# Patient Record
Sex: Male | Born: 1955 | Race: White | Hispanic: No | Marital: Single | Smoking: Former smoker
Health system: Southern US, Community
[De-identification: ages and names within clinical notes are randomized; demographics above are authoritative.]

## PROBLEM LIST (undated history)

## (undated) DIAGNOSIS — I10 Essential (primary) hypertension: Secondary | ICD-10-CM

## (undated) DIAGNOSIS — K4090 Unilateral inguinal hernia, without obstruction or gangrene, not specified as recurrent: Secondary | ICD-10-CM

## (undated) DIAGNOSIS — Z8619 Personal history of other infectious and parasitic diseases: Secondary | ICD-10-CM

## (undated) HISTORY — DX: Personal history of other infectious and parasitic diseases: Z86.19

## (undated) HISTORY — DX: Unilateral inguinal hernia, without obstruction or gangrene, not specified as recurrent: K40.90

---

## 2005-08-23 ENCOUNTER — Emergency Department: Payer: Self-pay | Admitting: Unknown Physician Specialty

## 2008-01-06 IMAGING — CR RIGHT TIBIA AND FIBULA - 2 VIEW
1 series · 3 of 3 positions shown · non-contrast
Comparison: none

REASON FOR EXAM: Injury
COMMENTS:

RESULT:     AP and lateral views of the RIGHT lower leg show no fracture,
dislocation or other acute bony abnormality. There is noted a nail which
extends partially into the soft tissues by about 3.5 cm. No associated bony
abnormality is seen.

[Series 1: view not recorded · 0.17mm/px · 3 of 3 slices shown]
[im 1/3]
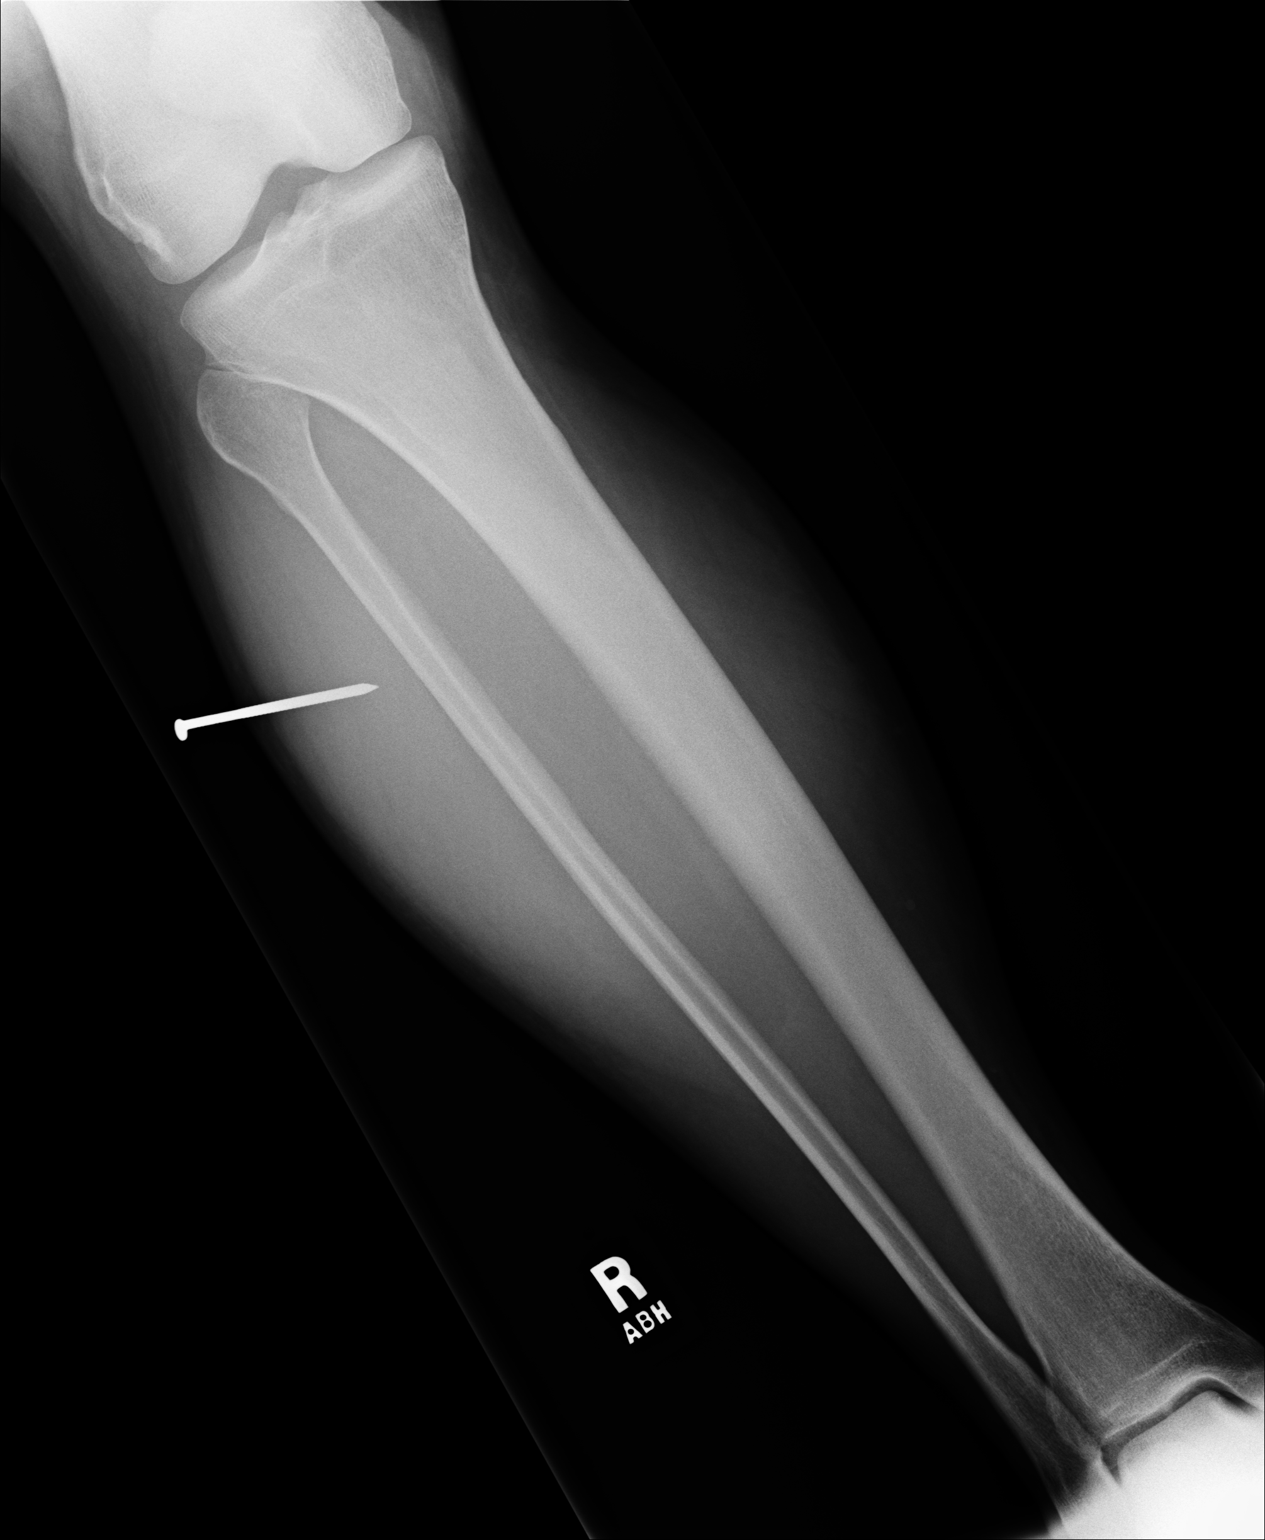
[im 2/3]
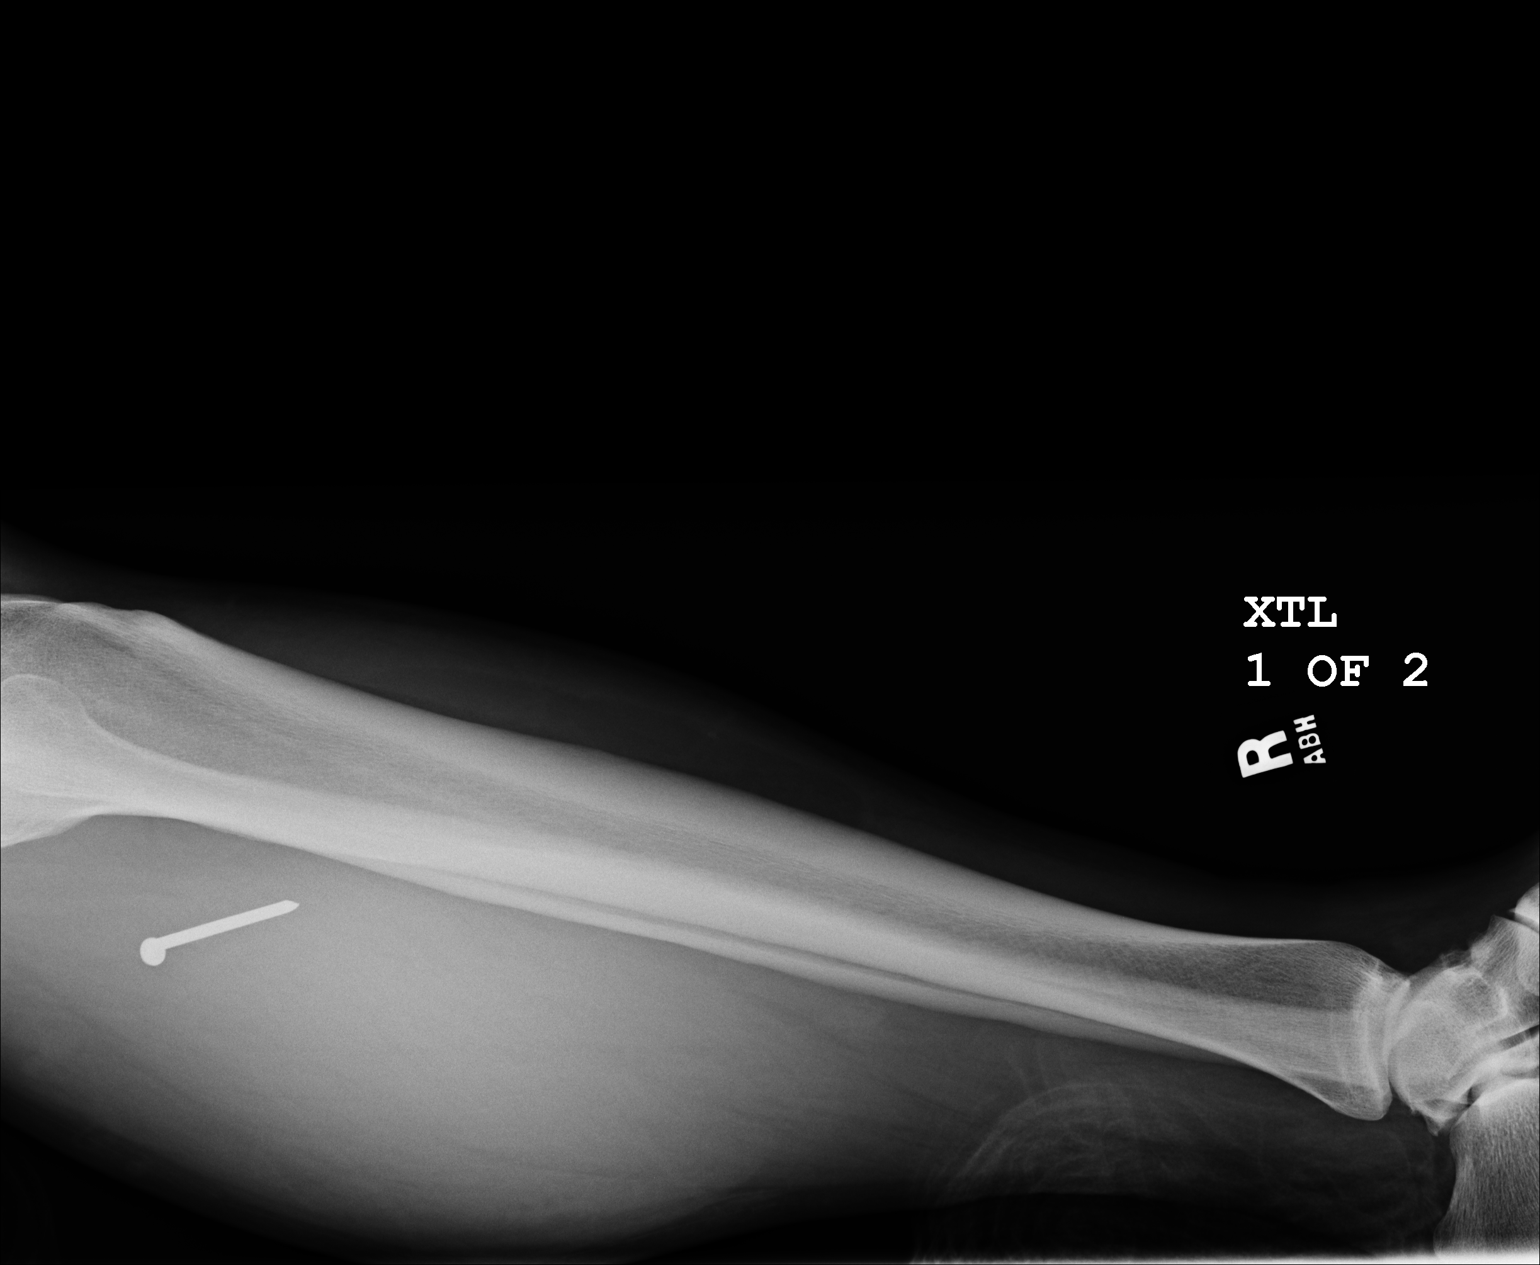
[im 3/3]
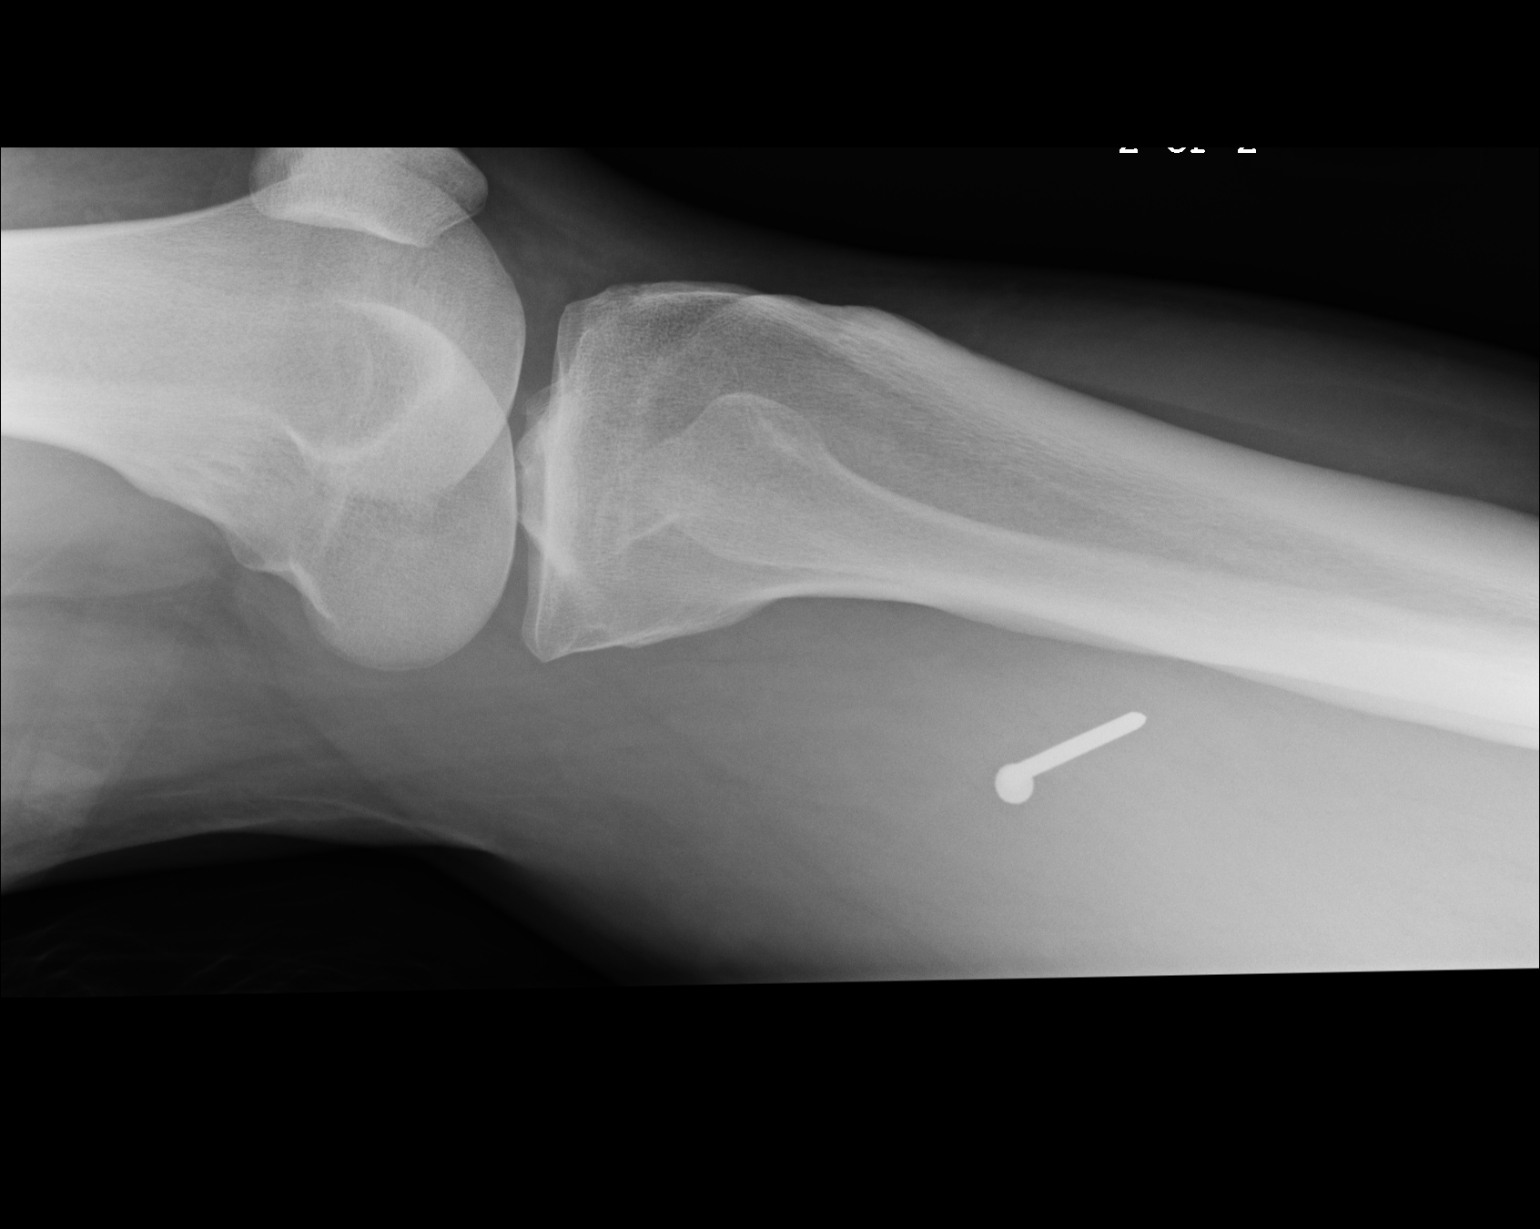

[3 of 3 positions shown; findings below may reference images not displayed]

IMPRESSION: 1.     No fracture or other acute bony abnormality is seen.
2.     There is a soft tissue foreign body consistent with a nail as noted
above.

## 2008-09-21 ENCOUNTER — Ambulatory Visit: Payer: Self-pay | Admitting: Family Medicine

## 2008-09-21 DIAGNOSIS — M67919 Unspecified disorder of synovium and tendon, unspecified shoulder: Secondary | ICD-10-CM | POA: Insufficient documentation

## 2008-09-21 DIAGNOSIS — M719 Bursopathy, unspecified: Secondary | ICD-10-CM

## 2008-09-21 DIAGNOSIS — M25519 Pain in unspecified shoulder: Secondary | ICD-10-CM

## 2008-10-18 ENCOUNTER — Ambulatory Visit: Payer: Self-pay | Admitting: Family Medicine

## 2008-12-10 ENCOUNTER — Ambulatory Visit: Payer: Self-pay | Admitting: Family Medicine

## 2008-12-13 LAB — CONVERTED CEMR LAB
BUN: 13 mg/dL (ref 6–23)
CO2: 29 meq/L (ref 19–32)
Chloride: 104 meq/L (ref 96–112)
Cholesterol: 184 mg/dL (ref 0–200)
Creatinine, Ser: 1.1 mg/dL (ref 0.4–1.5)
Glucose, Bld: 84 mg/dL (ref 70–99)
Potassium: 3.9 meq/L (ref 3.5–5.1)
Triglycerides: 135 mg/dL (ref 0.0–149.0)

## 2008-12-28 ENCOUNTER — Encounter (INDEPENDENT_AMBULATORY_CARE_PROVIDER_SITE_OTHER): Payer: Self-pay | Admitting: *Deleted

## 2008-12-28 ENCOUNTER — Ambulatory Visit: Payer: Self-pay | Admitting: Family Medicine

## 2008-12-28 DIAGNOSIS — R5383 Other fatigue: Secondary | ICD-10-CM

## 2008-12-28 DIAGNOSIS — R5381 Other malaise: Secondary | ICD-10-CM | POA: Insufficient documentation

## 2008-12-28 LAB — CONVERTED CEMR LAB

## 2008-12-30 LAB — CONVERTED CEMR LAB
ALT: 21 units/L (ref 0–53)
AST: 19 units/L (ref 0–37)
Albumin: 3.7 g/dL (ref 3.5–5.2)
Alkaline Phosphatase: 115 units/L (ref 39–117)
Basophils Absolute: 0 10*3/uL (ref 0.0–0.1)
Bilirubin, Direct: 0.1 mg/dL (ref 0.0–0.3)
Eosinophils Absolute: 0.4 10*3/uL (ref 0.0–0.7)
Hemoglobin: 16.5 g/dL (ref 13.0–17.0)
Lymphocytes Relative: 13.5 % (ref 12.0–46.0)
MCHC: 34.1 g/dL (ref 30.0–36.0)
Neutro Abs: 6.8 10*3/uL (ref 1.4–7.7)
Platelets: 228 10*3/uL (ref 150.0–400.0)
RDW: 11.8 % (ref 11.5–14.6)
Total Protein: 6.9 g/dL (ref 6.0–8.3)

## 2009-01-22 HISTORY — PX: COLONOSCOPY: SHX174

## 2009-02-04 ENCOUNTER — Encounter (INDEPENDENT_AMBULATORY_CARE_PROVIDER_SITE_OTHER): Payer: Self-pay | Admitting: *Deleted

## 2009-02-07 ENCOUNTER — Ambulatory Visit: Payer: Self-pay | Admitting: Gastroenterology

## 2009-02-21 ENCOUNTER — Ambulatory Visit: Payer: Self-pay | Admitting: Gastroenterology

## 2009-02-25 ENCOUNTER — Encounter: Payer: Self-pay | Admitting: Gastroenterology

## 2010-01-09 ENCOUNTER — Encounter: Payer: Self-pay | Admitting: Gastroenterology

## 2010-02-16 ENCOUNTER — Encounter: Payer: Self-pay | Admitting: Gastroenterology

## 2010-02-21 NOTE — Miscellaneous (Signed)
Summary: [facility] Previsit/prep  Clinical Lists Changes  Medications: Added new medication of MOVIPREP 100 GM  SOLR (PEG-KCL-NACL-NASULF-NA ASC-C) As per prep instructions. - Signed Rx of MOVIPREP 100 GM  SOLR (PEG-KCL-NACL-NASULF-NA ASC-C) As per prep instructions.;  #1 x 0;  Signed;  Entered by: Wyona Almas RN;  Authorized by: Rachael Fee MD;  Method used: [id number] [address], [place], [place]  [address], Ph: [phone], Fax: [phone] Observations: Added new observation of NKA: T (02/07/2009 8:23)    Prescriptions: MOVIPREP 100 GM  SOLR (PEG-KCL-NACL-NASULF-NA ASC-C) As per prep instructions.  #1 x 0   Entered by:   Wyona Almas RN   Authorized by:   Rachael Fee MD   Signed by:   Wyona Almas RN on 02/07/2009   Method used:   [facility]ronically to        [id number] ([facility]       [address]       [place], [place]  [address]       Ph: [phone]       Fax: [phone]   RxID:   [id number]

## 2010-02-21 NOTE — Procedures (Signed)
Summary: Colonoscopy  Patient: [name] Note: All result statuses are Final unless otherwise noted.  Tests: (1) Colonoscopy (COL)   COL Colonoscopy           DONE     [facility]     [address]     [place], [place]  [address]           COLONOSCOPY PROCEDURE REPORT           PATIENT:  Curtis Stewart, Curtis Stewart  MR#:  [id number]     BIRTHDATE:  10-18-1955, 53 yrs. old  GENDER:  male           ENDOSCOPIST:  Rachael Fee, MD     Referred by:  Elpidio Galea. Copland, M.D.           PROCEDURE DATE:  02/21/2009     PROCEDURE:  Colonoscopy with snare polypectomy     ASA CLASS:  Class II     INDICATIONS:  Routine Risk Screening           MEDICATIONS:   Fentanyl 100 mcg IV, Versed 10 mg IV           DESCRIPTION OF PROCEDURE:   After the risks benefits and     alternatives of the procedure were thoroughly explained, informed     consent was obtained.  Digital rectal exam was performed and     revealed no rectal masses.   The LB CF-[id number] endoscope     was introduced through the anus and advanced to the cecum, which     was identified by both the appendix and ileocecal valve, without     limitations.  The quality of the prep was good, using MoviPrep.     The instrument was then slowly withdrawn as the colon was fully     examined.     <<PROCEDUREIMAGES>>           FINDINGS:  There were nine small polyps, all sessile. All were     removed and sent to pathology (jar 1). These ranged in size from     2mm to 5mm, were located in cecum, ascending, transverse,     descending colon segments and were all removed with cold snare     (see image3, image4, image5, and image6).  Moderate diverticulosis     was found. These were located throughout colon, most predominantly     in right colon (see image3).  External hemorrhoids were found.     These were medium sized, non-thrombosed.  This was otherwise a     normal examination of the colon (see image1, image2, and image7).  Retroflexed views in the rectum revealed no abnormalities.  The     scope was then withdrawn from the patient and the procedure     completed.           COMPLICATIONS:  None           ENDOSCOPIC IMPRESSION:     1) Nine small polyps, all removed and sent to pathology     2) Moderate diverticulosis (mainly in right colon)     3) External hemorrhoids     4) Otherwise normal examination           RECOMMENDATIONS:     1) If the polyp(s) removed today are proven to be adenomatous     (pre-cancerous) polyps, you will need a colonoscopy in 1-3 years.     Otherwise you should continue to follow  colorectal cancer     screening guidelines for "routine risk" patients with a     colonoscopy in 10 years.     2) You will receive a letter within 1-2 weeks with the results     of your biopsy as well as final recommendations. Please call my     office if you have not received a letter after 3 weeks.           ______________________________     Rachael Fee, MD           n.     eSIGNED:   Rachael Fee at 02/21/2009 09:35 AM           Georjean Mode, [id number]  Note: An exclamation mark (!) indicates a result that was not dispersed into the flowsheet. Document Creation Date: 02/21/2009 9:35 AM _______________________________________________________________________  (1) Order result status: Final Collection or observation date-time: 02/21/2009 09:28 Requested date-time:  Receipt date-time:  Reported date-time:  Referring Physician:   Ordering Physician: Rob Bunting [phone]) Specimen Source:  Source: Launa Grill Order Number: [id number] Lab site:   Appended Document: Colonoscopy recall     Procedures Next Due Date:    Colonoscopy: 01/2010

## 2010-02-21 NOTE — Letter (Signed)
Summary: [facility] Instructions  [facility]  [address] [place], [place] [address]   Phone: [phone]  Fax: [phone]       Curtis Stewart    01-Feb-1955    MRN: [id number]        Procedure Day /Date:  02/21/09  Monday     Arrival Time:_8:00am      Procedure Time:_9:00am     Location of Procedure:                    _x _  [facility] (4th Floor)                        PREPARATION FOR COLONOSCOPY WITH MOVIPREP   Starting 5 days prior to your procedure _1/26/11 _ do not eat nuts, seeds, popcorn, corn, beans, peas,  salads, or any raw vegetables.  Do not take any fiber supplements (e.g. Metamucil, Citrucel, and Benefiber).  THE DAY BEFORE YOUR PROCEDURE         DATE:  02/20/09  DAY:  Sunday  1.  Drink clear liquids the entire day-NO SOLID FOOD  2.  Do not drink anything colored red or purple.  Avoid juices with pulp.  No orange juice.  3.  Drink at least 64 oz. (8 glasses) of fluid/clear liquids during the day to prevent dehydration and help the prep work efficiently.  CLEAR LIQUIDS INCLUDE: Water Jello Ice Popsicles Tea (sugar ok, no milk/cream) Powdered fruit flavored drinks Coffee (sugar ok, no milk/cream) Gatorade Juice: apple, white grape, white cranberry  Lemonade Clear bullion, consomm, broth Carbonated beverages (any kind) Strained chicken noodle soup Hard Candy                             4.  In the morning, mix first dose of MoviPrep solution:    Empty 1 Pouch A and 1 Pouch B into the disposable container    Add lukewarm drinking water to the top line of the container. Mix to dissolve    Refrigerate (mixed solution should be used within 24 hrs)  5.  Begin drinking the prep at 5:00 p.m. The MoviPrep container is divided by 4 marks.   Every 15 minutes drink the solution down to the next mark (approximately 8 oz) until the full liter is complete.   6.  Follow completed prep with 16 oz of clear liquid of your choice  (Nothing red or purple).  Continue to drink clear liquids until bedtime.  7.  Before going to bed, mix second dose of MoviPrep solution:    Empty 1 Pouch A and 1 Pouch B into the disposable container    Add lukewarm drinking water to the top line of the container. Mix to dissolve    Refrigerate  THE DAY OF YOUR PROCEDURE      DATE:  02/21/09 DAY:  Monday  Beginning at _ 4:00 a.m. (5 hours before procedure):         1. Every 15 minutes, drink the solution down to the next mark (approx 8 oz) until the full liter is complete.  2. Follow completed prep with 16 oz. of clear liquid of your choice.    3. You may drink clear liquids until _7:00am  (2 HOURS BEFORE PROCEDURE).   MEDICATION INSTRUCTIONS  Unless otherwise instructed, you should take regular prescription medications with a small sip of water   as early  as possible the morning of your procedure.        OTHER INSTRUCTIONS  You will need a responsible adult at least 54 years of age to accompany you and drive you home.   This person must remain in the waiting room during your procedure.  Wear loose fitting clothing that is easily removed.  Leave jewelry and other valuables at home.  However, you may wish to bring a book to read or  an iPod/MP3 player to listen to music as you wait for your procedure to start.  Remove all body piercing jewelry and leave at home.  Total time from sign-in until discharge is approximately 2-3 hours.  You should go home directly after your procedure and rest.  You can resume normal activities the  day after your procedure.  The day of your procedure you should not:   Drive   Make legal decisions   Operate machinery   Drink alcohol   Return to work  You will receive specific instructions about eating, activities and medications before you leave.    The above instructions have been reviewed and explained to me by   Wyona Almas RN  February 07, 2009 8:58 AM     I fully  understand and can verbalize these instructions _____________________________ Date _________

## 2010-02-21 NOTE — Letter (Signed)
Summary: Results Letter  [facility]  [address] [place], [place] [address]   Phone: [phone]  Fax: [phone]        February 25, 2009 MRN: [id number]    JACEK COLSON [address] - AT [facility] [place], [place]  [address]    Dear Mr. Frye,   The polyp(s) removed during your recent procedure were proven to be adenomatous.  These are pre-cancerous polyps that may have grown into cancers if they had not been removed.  Based on current nationally recognized surveillance guidelines, I recommend that you have a repeat colonoscopy in 1 year.   We will therefore put your information in our reminder system and will contact you in 1 year to schedule a repeat procedure.  Please call if you have any questions or concerns.       Sincerely,  Rachael Fee MD  This letter has been electronically signed by your physician.  Appended Document: Results Letter Letter mailed 2.8.11

## 2010-02-23 NOTE — Letter (Signed)
Summary: Colonoscopy Letter  [facility]  [address] [place], [place] [address]   Phone: [phone]  Fax: [phone]      February 16, 2010 MRN: [id number]   Curtis Stewart [address] - AT [facility] [place], [place]  [address]   Dear Mr. Brazel,   According to your medical record, it is time for you to schedule a Colonoscopy. The American Cancer Society recommends this procedure as a method to detect early colon cancer. Patients with a family history of colon cancer, or a personal history of colon polyps or inflammatory bowel disease are at increased risk.  This letter has been generated based on the recommendations made at the time of your procedure. If you feel that in your particular situation this may no longer apply, please contact our office.  Please call our office at [phone] to schedule this appointment or to update your records at your earliest convenience.  Thank you for cooperating with [place] to provide you with the very best care possible.   Sincerely,  Rachael Fee, M.D.  [facility] [phone]

## 2010-03-01 NOTE — Procedures (Signed)
Summary: Recall Assessment/[facility]  Recall Assessment/[facility]   Imported By: Sherian Rein 02/20/2010 11:52:11  _____________________________________________________________________  External Attachment:    Type:   Image     Comment:   External Document

## 2011-10-05 ENCOUNTER — Encounter: Payer: Self-pay | Admitting: Gastroenterology

## 2012-03-05 ENCOUNTER — Ambulatory Visit: Payer: Self-pay | Admitting: Family Medicine

## 2012-03-10 ENCOUNTER — Encounter: Payer: Self-pay | Admitting: Family Medicine

## 2012-03-10 ENCOUNTER — Ambulatory Visit (INDEPENDENT_AMBULATORY_CARE_PROVIDER_SITE_OTHER): Payer: BC Managed Care – PPO | Admitting: Family Medicine

## 2012-03-10 ENCOUNTER — Telehealth: Payer: Self-pay

## 2012-03-10 ENCOUNTER — Ambulatory Visit (INDEPENDENT_AMBULATORY_CARE_PROVIDER_SITE_OTHER)
Admission: RE | Admit: 2012-03-10 | Discharge: 2012-03-10 | Disposition: A | Discharge status: Home / Self Care | Payer: BC Managed Care – PPO | Source: Ambulatory Visit | Attending: Family Medicine | Admitting: Family Medicine

## 2012-03-10 VITALS — BP 140/92 | HR 75 | Temp 98.5°F | Ht 69.0 in | Wt 191.5 lb

## 2012-03-10 DIAGNOSIS — K4090 Unilateral inguinal hernia, without obstruction or gangrene, not specified as recurrent: Secondary | ICD-10-CM

## 2012-03-10 DIAGNOSIS — Z1211 Encounter for screening for malignant neoplasm of colon: Secondary | ICD-10-CM

## 2012-03-10 DIAGNOSIS — F17200 Nicotine dependence, unspecified, uncomplicated: Secondary | ICD-10-CM

## 2012-03-10 DIAGNOSIS — R05 Cough: Secondary | ICD-10-CM

## 2012-03-10 DIAGNOSIS — D369 Benign neoplasm, unspecified site: Secondary | ICD-10-CM

## 2012-03-10 DIAGNOSIS — Z13220 Encounter for screening for lipoid disorders: Secondary | ICD-10-CM

## 2012-03-10 DIAGNOSIS — R918 Other nonspecific abnormal finding of lung field: Secondary | ICD-10-CM

## 2012-03-10 DIAGNOSIS — R634 Abnormal weight loss: Secondary | ICD-10-CM

## 2012-03-10 DIAGNOSIS — Z125 Encounter for screening for malignant neoplasm of prostate: Secondary | ICD-10-CM

## 2012-03-10 DIAGNOSIS — R9389 Abnormal findings on diagnostic imaging of other specified body structures: Secondary | ICD-10-CM

## 2012-03-10 LAB — CBC WITH DIFFERENTIAL/PLATELET
Basophils Absolute: 0 10*3/uL (ref 0.0–0.1)
Eosinophils Absolute: 0.2 10*3/uL (ref 0.0–0.7)
HCT: 37.3 % — ABNORMAL LOW (ref 39.0–52.0)
Hemoglobin: 12.2 g/dL — ABNORMAL LOW (ref 13.0–17.0)
Lymphs Abs: 0.6 10*3/uL — ABNORMAL LOW (ref 0.7–4.0)
MCHC: 32.6 g/dL (ref 30.0–36.0)
Monocytes Absolute: 0.8 10*3/uL (ref 0.1–1.0)
Neutro Abs: 9 10*3/uL — ABNORMAL HIGH (ref 1.4–7.7)
Platelets: 455 10*3/uL — ABNORMAL HIGH (ref 150.0–400.0)
RDW: 15.5 % — ABNORMAL HIGH (ref 11.5–14.6)

## 2012-03-10 LAB — BASIC METABOLIC PANEL
Calcium: 8.8 mg/dL (ref 8.4–10.5)
Creatinine, Ser: 1 mg/dL (ref 0.4–1.5)
GFR: 83.14 mL/min (ref >60.00)
Sodium: 134 mEq/L — ABNORMAL LOW (ref 135–145)

## 2012-03-10 LAB — LIPID PANEL
HDL: 23.6 mg/dL — ABNORMAL LOW (ref >39.00)
LDL Cholesterol: 98 mg/dL (ref 0–99)
Total CHOL/HDL Ratio: 6
Triglycerides: 117 mg/dL (ref 0.0–149.0)
VLDL: 23.4 mg/dL (ref 0.0–40.0)

## 2012-03-10 LAB — HEPATIC FUNCTION PANEL
Bilirubin, Direct: 0.2 mg/dL (ref 0.0–0.3)
Total Bilirubin: 0.7 mg/dL (ref 0.3–1.2)
Total Protein: 6.9 g/dL (ref 6.0–8.3)

## 2012-03-10 NOTE — Progress Notes (Signed)
Nature conservation officer at [facility] [address] [place] [address] Phone: [phone] Fax: [phone]  Date:  03/10/2012   Name:  Curtis Stewart   DOB:  [birth year]   MRN:  [id number] Gender: male Age: 57 y.o.  Primary Physician:  Hannah Beat, MD  Evaluating MD: Hannah Beat, MD   Chief Complaint: Establish Care   History of Present Illness:  Curtis Stewart is a 56 y.o. pleasant patient who presents with the following:  25 pound weight loss. Less drink. Other than this however, he has not been trying to lose weight, he does not follow a diet. When he was last here several years ago, he weighed 217 and today he is 191.  He has a 40 year smoking history and quit in 2012.  He had a history of a adenomatous polyp in 2011, and was supposed to have close f/u but has missed his f/u colonoscopies.   He also has a large symptomatic R inguinal hernia. It is now in his testicular sack and quite bothersome to him.   Patient Active Problem List  Diagnosis  . SHOULDER PAIN, RIGHT  . ROTATOR CUFF INJURY, RIGHT SHOULDER  . FATIGUE    Past Medical History  Diagnosis Date  . History of chicken pox     No past surgical history on file.  History  Substance Use Topics  . Smoking status: Former Games developer  . Smokeless tobacco: Not on file  . Alcohol Use: Yes    Family History  Problem Relation Age of Onset  . Diabetes Mother   . Diabetes Father     No Known Allergies  Medication list has been reviewed and updated.  No outpatient prescriptions prior to visit.   No facility-administered medications prior to visit.    Review of Systems:  Weight loss as above, occ sob, no chest pain, no fever, no recent infectious process  Physical Examination: BP 140/92  Pulse 75  Temp(Src) 98.5 F (36.9 C) (Oral)  Ht 5\' 9"  (1.753 m)  Wt 191 lb 8 oz (86.864 kg)  BMI 28.27 kg/m2  SpO2 95%  Ideal Body Weight: Weight in (lb) to have BMI = 25: 168.9   GEN: A and O x 3.  WDWN. NAD.    ENT: Nose clear, ext NML.  No LAD.  No JVD.  TM's clear. Oropharynx clear.  PULM: Normal WOB, no distress. No crackles, wheezes, rhonchi. CV: RRR, no M/G/R, No rubs, No JVD.   EXT: warm and well-perfused, No c/c/e. PSYCH: Pleasant and conversant.  Dg Chest 2 View  03/10/2012  *RADIOLOGY REPORT*  Clinical Data: 57 year old male with cough and weight loss.  CHEST - 2 VIEW  Comparison: None.  Findings: Confluent bilateral lower lobe and possibly right middle lobe pulmonary opacity.  Little if any associated pulmonary volume loss.  Cardiac size and mediastinal contours are within normal limits.  Visualized tracheal air column is within normal limits. Upper lobes appear within normal limits.  No pneumothorax or pleural effusion.  IMPRESSION: Confluent bilateral lower lobe and possibly also right middle lobe abnormal pulmonary opacity.  This is nonspecific with top differential considerations including infection (bilateral pneumonia), inflammatory process (such as organizing pneumonia), or neoplastic process (such as bronchoalveolar cell carcinoma). If there is clinical evidence of acute infectious pneumonia, post- treatment radiographs are recommended to document resolution. Otherwise, consider chest CT (IV contrast preferred) or bronchoscopy to attempt further characterization.  These results will be called to the ordering clinician or representative by the  Printmaker, and communication documented in the PACS Dashboard.   Original Report Authenticated By: Erskine Speed, M.D.     Assessment and Plan:   Weight loss, unintentional - Plan: DG Chest 2 View, CBC with Differential, Basic metabolic panel, Hepatic function panel, TSH  Screening for lipoid disorders - Plan: Lipid panel  Cough - Plan: DG Chest 2 View, CT Chest W Contrast  Special screening for malignant neoplasms, colon - Plan: Ambulatory referral to Gastroenterology  Adenomatous polyp - Plan: Ambulatory referral to  Gastroenterology  Encounter for screening for malignant neoplasm of prostate - Plan: PSA  Inguinal hernia - Plan: Ambulatory referral to General Surgery  Smoker - Plan: CT Chest W Contrast  Abnormal chest x-ray - Plan: CT Chest W Contrast  With chest xray above, 40 + pack years, ? Mass on chest xray, obtain a CT of the chest with contrast to evaluate for neoplastic process.  He would like elective referral for general surgery regarding his hernia  Orders Today:  Orders Placed This Encounter  Procedures  . DG Chest 2 View    Standing Status: Future     Number of Occurrences: 1     Standing Expiration Date: 05/10/2013    Order Specific Question:  Preferred imaging location?    Answer:  [facility]    Order Specific Question:  Reason for exam:    Answer:  cough, smoker  . CT Chest W Contrast    Standing Status: Future     Number of Occurrences:      Standing Expiration Date: 05/08/2013    Order Specific Question:  Reason for exam:    Answer:  abnormal chest xray, opacity of xray, rule out neoplasm    Order Specific Question:  Preferred imaging location?    Answer:  [facility] [address]  . CBC with Differential  . Basic metabolic panel  . Hepatic function panel  . Lipid panel  . TSH  . PSA  . Ambulatory referral to Gastroenterology    Referral Priority:  Routine    Referral Type:  Consultation    Referral Reason:  Specialty Services Required    Requested Specialty:  Gastroenterology    Number of Visits Requested:  1  . Ambulatory referral to General Surgery    Referral Priority:  Routine    Referral Type:  Surgical    Referral Reason:  Specialty Services Required    Requested Specialty:  General Surgery    Number of Visits Requested:  1    Updated Medication List: (Includes new medications, updates to list, dose adjustments) No orders of the defined types were placed in this encounter.    Medications Discontinued: There are no discontinued medications.     Obtain a CT of the chest with IV contrast to evaluate for potential neoplasm.  Signed, Elpidio Galea. Talaysha Freeberg, MD 03/10/2012 10:49 AM

## 2012-03-10 NOTE — Telephone Encounter (Addendum)
Erskine Squibb with [facility] called chest xray report which is already listed under imaging tab. Dr Patsy Lager notified and was already aware of report.

## 2012-03-10 NOTE — Patient Instructions (Addendum)
REFERRAL: GO THE THE FRONT ROOM AT THE ENTRANCE OF OUR CLINIC, NEAR CHECK IN. ASK FOR MARION. SHE WILL HELP YOU SET UP YOUR REFERRAL. DATE: TIME:  

## 2012-03-11 ENCOUNTER — Ambulatory Visit (INDEPENDENT_AMBULATORY_CARE_PROVIDER_SITE_OTHER)
Admission: RE | Admit: 2012-03-11 | Discharge: 2012-03-11 | Disposition: A | Discharge status: Home / Self Care | Payer: BC Managed Care – PPO | Source: Ambulatory Visit | Attending: Family Medicine | Admitting: Family Medicine

## 2012-03-11 DIAGNOSIS — F17200 Nicotine dependence, unspecified, uncomplicated: Secondary | ICD-10-CM

## 2012-03-11 DIAGNOSIS — R05 Cough: Secondary | ICD-10-CM

## 2012-03-11 DIAGNOSIS — R918 Other nonspecific abnormal finding of lung field: Secondary | ICD-10-CM

## 2012-03-11 DIAGNOSIS — R9389 Abnormal findings on diagnostic imaging of other specified body structures: Secondary | ICD-10-CM

## 2012-03-11 DIAGNOSIS — R059 Cough, unspecified: Secondary | ICD-10-CM

## 2012-03-11 MED ORDER — IOHEXOL 300 MG/ML  SOLN
80.0000 mL | Freq: Once | INTRAMUSCULAR | Status: AC | PRN
  Administered 2012-03-11: 80 mL via INTRAVENOUS

## 2012-03-12 ENCOUNTER — Other Ambulatory Visit: Payer: Self-pay | Admitting: Family Medicine

## 2012-03-12 ENCOUNTER — Ambulatory Visit (INDEPENDENT_AMBULATORY_CARE_PROVIDER_SITE_OTHER): Payer: BC Managed Care – PPO | Admitting: Internal Medicine

## 2012-03-12 ENCOUNTER — Encounter: Payer: Self-pay | Admitting: Internal Medicine

## 2012-03-12 VITALS — BP 130/72 | HR 80 | Temp 98.5°F | Ht 69.0 in | Wt 187.4 lb

## 2012-03-12 NOTE — Progress Notes (Signed)
  Subjective:    Patient ID: Curtis Stewart, male    DOB: 1956/01/13  MRN: [id number]  HPI  37 yowm quit smoking Nov 2012 with new cough Jan 2014 > cxr with spot > CT chest > referred to pulmonary clinic .03/12/2012  by Dr Dallas Schimke  03/12/2012 1st pulmonary ov/ cc acute onset cough jan 2014 min productive mostly white mucus assoc with gradually worsening doe x heavy exertion and over 25 lb of wt loss but not hemoptysis.  No obvious daytime variabilty or assoc  cp or chest tightness, subjective wheeze overt sinus or hb symptoms. No unusual exp hx or h/o childhood pna/ asthma or premature birth to his knowledge.   Sleeping ok without nocturnal  or early am exacerbation  of respiratory  c/o's or need for noct saba. Also denies any obvious fluctuation of symptoms with weather or environmental changes or other aggravating or alleviating factors except as outlined above     Review of Systems  Constitutional: Positive for unexpected weight change. Negative for fever.  HENT: Positive for sore throat. Negative for ear pain, nosebleeds, congestion, rhinorrhea, sneezing, trouble swallowing, dental problem, postnasal drip and sinus pressure.   Eyes: Negative for redness and itching.  Respiratory: Positive for cough and shortness of breath. Negative for chest tightness and wheezing.   Cardiovascular: Negative for palpitations and leg swelling.  Gastrointestinal: Negative for nausea and vomiting.  Genitourinary: Negative for dysuria.  Musculoskeletal: Negative for joint swelling.  Skin: Negative for rash.  Neurological: Negative for headaches.  Hematological: Does not bruise/bleed easily.  Psychiatric/Behavioral: Negative for dysphoric mood. The patient is not nervous/anxious.        Objective:   Physical Exam Wt Readings from Last 3 Encounters:  03/12/12 187 lb 6.4 oz (85.004 kg)  03/10/12 191 lb 8 oz (86.864 kg)  12/28/08 225 lb 3.2 oz (102.15 kg)    Anxious amb wm chronically ill  appearing HEENT mild turbinate edema.  Oropharynx no thrush or excess pnd or cobblestoning.  No JVD or cervical adenopathy. Mild accessory muscle hypertrophy. Trachea midline, nl thryroid. Chest was hyperinflated by percussion with bronchial bs L base and mn increased exp time without wheeze. Hoover sign positive at mid inspiration. Regular rate and rhythm without murmur gallop or rub or increase P2 or edema.  Abd: no hsm, nl excursion. Ext warm without cyanosis or clubbing.    CXR/CT review > see a/p     Assessment & Plan:

## 2012-03-12 NOTE — Patient Instructions (Addendum)
Tuesday 25th of February outpatient registration  [phone] am for bronchoscopy at 815am  with nothing to eat or drink after midnight on Monday night.

## 2012-03-13 ENCOUNTER — Ambulatory Visit (INDEPENDENT_AMBULATORY_CARE_PROVIDER_SITE_OTHER): Payer: Self-pay | Admitting: General Surgery

## 2012-03-14 NOTE — Assessment & Plan Note (Signed)
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  Discussed in detail all the  indications, usual  risks and alternatives  relative to the benefits with patient who agrees to proceed with bronchoscopy with biopsy.

## 2012-03-18 ENCOUNTER — Encounter (HOSPITAL_COMMUNITY): Payer: Self-pay | Admitting: Respiratory Therapy

## 2012-03-18 ENCOUNTER — Ambulatory Visit (HOSPITAL_COMMUNITY)
Admission: RE | Admit: 2012-03-18 | Discharge: 2012-03-18 | Disposition: A | Discharge status: Home / Self Care | Payer: BC Managed Care – PPO | Source: Ambulatory Visit | Attending: Internal Medicine | Admitting: Internal Medicine

## 2012-03-18 ENCOUNTER — Ambulatory Visit (HOSPITAL_COMMUNITY): Payer: BC Managed Care – PPO

## 2012-03-18 ENCOUNTER — Encounter (HOSPITAL_COMMUNITY)
Admission: RE | Disposition: A | Discharge status: Home / Self Care | Payer: Self-pay | Source: Ambulatory Visit | Attending: Internal Medicine

## 2012-03-18 DIAGNOSIS — R918 Other nonspecific abnormal finding of lung field: Secondary | ICD-10-CM

## 2012-03-18 DIAGNOSIS — R222 Localized swelling, mass and lump, trunk: Secondary | ICD-10-CM | POA: Insufficient documentation

## 2012-03-18 HISTORY — PX: VIDEO BRONCHOSCOPY: SHX5072

## 2012-03-18 SURGERY — BRONCHOSCOPY, WITH FLUOROSCOPY
Anesthesia: Moderate Sedation | Laterality: Bilateral

## 2012-03-18 MED ORDER — MEPERIDINE HCL 25 MG/ML IJ SOLN
INTRAMUSCULAR | Status: DC | PRN
  Administered 2012-03-18: 50 mg via INTRAVENOUS
  Administered 2012-03-18 (×2): 25 mg via INTRAVENOUS

## 2012-03-18 MED ORDER — LIDOCAINE HCL 2 % EX GEL
Freq: Once | CUTANEOUS | Status: DC
  Filled 2012-03-18: qty 5

## 2012-03-18 MED ORDER — PHENYLEPHRINE HCL 0.25 % NA SOLN
NASAL | Status: DC | PRN
  Administered 2012-03-18: 1 via NASAL

## 2012-03-18 MED ORDER — MEPERIDINE HCL 100 MG/ML IJ SOLN: 100.0000 mg | Freq: Once | INTRAMUSCULAR | Status: DC

## 2012-03-18 MED ORDER — MIDAZOLAM HCL 5 MG/ML IJ SOLN: 1.0000 mg | Freq: Once | INTRAMUSCULAR | Status: DC

## 2012-03-18 MED ORDER — MIDAZOLAM HCL 10 MG/2ML IJ SOLN
INTRAMUSCULAR | Status: DC | PRN
  Administered 2012-03-18: 2.5 mg via INTRAVENOUS
  Administered 2012-03-18: 5 mg via INTRAVENOUS

## 2012-03-18 MED ORDER — PHENYLEPHRINE HCL 0.25 % NA SOLN: 1.0000 | Freq: Four times a day (QID) | NASAL | Status: DC | PRN

## 2012-03-18 MED ORDER — LIDOCAINE HCL 2 % EX GEL
CUTANEOUS | Status: DC | PRN
  Administered 2012-03-18: 1

## 2012-03-18 MED ORDER — LIDOCAINE HCL 1 % IJ SOLN
INTRAMUSCULAR | Status: DC | PRN
  Administered 2012-03-18: 6 mL via RESPIRATORY_TRACT

## 2012-03-18 MED ORDER — MEPERIDINE HCL 100 MG/ML IJ SOLN
INTRAMUSCULAR | Status: AC
  Filled 2012-03-18: qty 2

## 2012-03-18 MED ORDER — MIDAZOLAM HCL 10 MG/2ML IJ SOLN
INTRAMUSCULAR | Status: AC
  Filled 2012-03-18: qty 4

## 2012-03-18 NOTE — Op Note (Signed)
Bronchoscopy Procedure Note  Date of Operation: 03/18/2012   Pre-op Diagnosis: lung mass  Post-op Diagnosis: same  Surgeon: Sandrea Hughs  Anesthesia: Monitored Local Anesthesia with Sedation  Operation: Video Flexible fiberoptic bronchoscopy, diagnostic   Findings: extensive bilateral severe cobblestoning without focal lesion  Specimen: TB BX  LLL,  LUL orifice endobronchial bx  Estimated Blood Loss: min   Complications: None  Indications and History: See updated H and P same date. The risks, benefits, complications, treatment options and expected outcomes were discussed with the patient.  The possibilities of reaction to medication, pulmonary aspiration, perforation of a viscus, bleeding, failure to diagnose a condition and creating a complication requiring transfusion or operation were discussed with the patient who freely signed the consent.    Description of Procedure: The patient was re-examined in the bronchoscopy suite and the site of surgery properly noted/marked.  The patient was identified  and the procedure verified as Flexible Video  Fiberoptic Bronchoscopy.  A Time Out was held and the above information confirmed.   After the induction of topical nasopharyngeal anesthesia, the patient was positioned  and the bronchoscope was passed through the R naris. The vocal cords were visualized and  1% buffered lidocaine 5 ml was topically placed onto the cords. The cords were wnl. The scope was then passed into the trachea.  1% buffered lidocaine given topically. 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  Attestation: I performed the procedure.  Sandrea Hughs,  MD Pulmonary and Critical Care Medicine [facility] Cell [phone] After 5:30 PM or weekends, call [phone]

## 2012-03-18 NOTE — H&P (Signed)
  56 yowm quit smoking Nov 2012 with new cough Jan 2014 > cxr with spot > CT chest > referred to pulmonary clinic .03/12/2012  by Dr Dallas Schimke   03/12/2012 1st pulmonary ov/ cc acute onset cough jan 2014 min productive mostly white mucus assoc with gradually worsening doe x heavy exertion and over 25 lb of wt loss but not hemoptysis.   No obvious daytime variabilty or assoc  cp or chest tightness, subjective wheeze overt sinus or hb symptoms. No unusual exp hx or h/o childhood pna/ asthma or premature birth to his knowledge.    Sleeping ok without nocturnal  or early am exacerbation  of respiratory  c/o's or need for noct saba. Also denies any obvious fluctuation of symptoms with weather or environmental changes or other aggravating or alleviating factors except as outlined above         Review of Systems  Constitutional: Positive for unexpected weight change. Negative for fever.  HENT: Positive for sore throat. Negative for ear pain, nosebleeds, congestion, rhinorrhea, sneezing, trouble swallowing, dental problem, postnasal drip and sinus pressure.   Eyes: Negative for redness and itching.  Respiratory: Positive for cough and shortness of breath. Negative for chest tightness and wheezing.   Cardiovascular: Negative for palpitations and leg swelling.  Gastrointestinal: Negative for nausea and vomiting.  Genitourinary: Negative for dysuria.  Musculoskeletal: Negative for joint swelling.  Skin: Negative for rash.  Neurological: Negative for headaches.  Hematological: Does not bruise/bleed easily.  Psychiatric/Behavioral: Negative for dysphoric mood. The patient is not nervous/anxious.          Objective:     Physical Exam Wt Readings from Last 3 Encounters:   03/12/12  187 lb 6.4 oz (85.004 kg)   03/10/12  191 lb 8 oz (86.864 kg)   12/28/08  225 lb 3.2 oz (102.15 kg)      Anxious amb wm chronically ill appearing HEENT mild turbinate edema.  Oropharynx no thrush or excess pnd or  cobblestoning.  No JVD or cervical adenopathy. Mild accessory muscle hypertrophy. Trachea midline, nl thryroid. Chest was hyperinflated by percussion with bronchial bs L base and mn increased exp time without wheeze. Hoover sign positive at mid inspiration. Regular rate and rhythm without murmur gallop or rub or increase P2 or edema.  Abd: no hsm, nl excursion. Ext warm without cyanosis or clubbing.     CXR/CT review > see a/p       Assessment & Plan:                                 Lung mass - Nyoka Cowden, MD at 03/14/2012  8:53 AM    Status: Written Related Problem: Lung mass           - See CT chest  03/11/12 > strongly suggestive of BAC with Air bronchograms present and clearly not resectable but needs expedient tissue dx to get started on available limited rx.   Discussed in detail all the  indications, usual  risks and alternatives  relative to the benefits with patient who agrees to proceed with bronchoscopy with biopsy.     Sandrea Hughs, MD Pulmonary and Critical Care Medicine [facility] Cell [phone] After 5:30 PM or weekends, call [phone]

## 2012-03-19 ENCOUNTER — Encounter (HOSPITAL_COMMUNITY): Payer: Self-pay | Admitting: Internal Medicine

## 2012-03-19 NOTE — Progress Notes (Signed)
Late Entry  Video Bronchoscopy Done  Intervention Bronchial washing Intervention Bronchial biopsy  Procedure tolerated well

## 2012-03-21 ENCOUNTER — Encounter: Payer: Self-pay | Admitting: Internal Medicine

## 2012-03-21 ENCOUNTER — Other Ambulatory Visit: Payer: Self-pay | Admitting: Internal Medicine

## 2012-03-21 MED ORDER — FLUCONAZOLE 200 MG PO TABS: 400.0000 mg | ORAL_TABLET | Freq: Every day | ORAL | Status: DC

## 2012-03-21 NOTE — Progress Notes (Signed)
Quick Note:  Done ______ 

## 2012-03-26 ENCOUNTER — Ambulatory Visit (INDEPENDENT_AMBULATORY_CARE_PROVIDER_SITE_OTHER): Payer: BC Managed Care – PPO | Admitting: Infectious Diseases

## 2012-03-26 ENCOUNTER — Encounter: Payer: Self-pay | Admitting: Infectious Diseases

## 2012-03-26 VITALS — BP 147/87 | HR 84 | Temp 97.6°F | Ht 69.0 in | Wt 187.0 lb

## 2012-03-26 DIAGNOSIS — B459 Cryptococcosis, unspecified: Secondary | ICD-10-CM

## 2012-03-26 MED ORDER — FLUCONAZOLE 200 MG PO TABS: 400.0000 mg | ORAL_TABLET | Freq: Every day | ORAL | Status: DC

## 2012-03-26 NOTE — Progress Notes (Signed)
  Subjective:    Patient ID: Curtis Stewart, male    DOB: [birth year], 56 y.o.   MRN: [id number]  HPI 57 yo M with 3 months hx of cough. Started after a "real bad cold" in January. He under went CT (03-11-12) showing : 1. Abnormal airspace consolidation within the left lower lobe is  concerning for primary pulmonary neoplasm.  2. Pathologically enlarged mediastinal lymph nodes which are  worrisome for metastatic adenopathy.  30. Small pericardial effusion. Pericardial involvement by tumor  is not excluded.  4. 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  5. Advise further evaluation with PET CT and biopsy to confirm  these imaging findings.  He underwent bronchoscopy on 03-18-12 showing non-necrotizing granuloma and fungal elements.  Was started on diflucan 03-21-12. Feels like he has been breathing easier now, less cough.  Lost 7 #.  Has a dog. No birds. No bat exposures, no caving. Quit smoking 2012  Review of Systems  Constitutional: Positive for chills and unexpected weight change. Negative for fever and appetite change.  Respiratory: Positive for cough. Negative for shortness of breath.        Feels like he has gasping on L chest.   Gastrointestinal: Negative for diarrhea and constipation.  Genitourinary: Negative for difficulty urinating.  Hematological: Negative for adenopathy.       Objective:   Physical Exam  Constitutional: He appears well-developed and well-nourished.  HENT:  Mouth/Throat: No oropharyngeal exudate.  Eyes: EOM are normal. Pupils are equal, round, and reactive to light.  Neck: Neck supple.  Cardiovascular: Normal rate, regular rhythm and normal heart sounds.   Pulmonary/Chest: Effort normal and breath sounds normal.  Abdominal: Soft. Bowel sounds are normal. There is no tenderness. There is no rebound.  Musculoskeletal: He exhibits no edema.  Lymphadenopathy:    He has no cervical adenopathy.    He  has no axillary adenopathy.          Assessment & Plan:

## 2012-03-26 NOTE — Assessment & Plan Note (Signed)
Spoke to the pt and his family member about the diagnosis. It is not unusual to see this presentation and it typically responds very well to flucaonazole without any sequlae. Will plan to treat him with diflucan for 6 months, have him return in 3 months for repeat LFTs. Will consider repeat CT scan of the lungs at that time. Will check his serum crytpo and HIV Ab today.

## 2012-03-27 ENCOUNTER — Telehealth: Payer: Self-pay | Admitting: *Deleted

## 2012-03-27 LAB — CRYPTOCOCCAL ANTIGEN: Cryptococcal Ag Titer: 1:640 {titer}

## 2012-03-27 NOTE — Telephone Encounter (Signed)
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 Advised the results are now in the system.

## 2012-06-26 ENCOUNTER — Ambulatory Visit (INDEPENDENT_AMBULATORY_CARE_PROVIDER_SITE_OTHER): Payer: BC Managed Care – PPO | Admitting: Infectious Diseases

## 2012-06-26 ENCOUNTER — Encounter: Payer: Self-pay | Admitting: Infectious Diseases

## 2012-06-26 VITALS — BP 135/85 | HR 70 | Temp 97.5°F | Ht 70.0 in | Wt 201.0 lb

## 2012-06-26 DIAGNOSIS — D1779 Benign lipomatous neoplasm of other sites: Secondary | ICD-10-CM

## 2012-06-26 DIAGNOSIS — B459 Cryptococcosis, unspecified: Secondary | ICD-10-CM

## 2012-06-26 DIAGNOSIS — D1720 Benign lipomatous neoplasm of skin and subcutaneous tissue of unspecified limb: Secondary | ICD-10-CM

## 2012-06-26 NOTE — Assessment & Plan Note (Signed)
Soft tissue swelling in RLE. 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 I have asked him to mention this to his PCP re Bx.

## 2012-06-26 NOTE — Progress Notes (Signed)
  Subjective:    Patient ID: Curtis Stewart, male    DOB: 1955-10-29, 57 y.o.   MRN: [id number]  HPI 58 yo M with 3 months hx of cough. Started after a "real bad cold" in January. He under went CT (03-11-12) showing :  1. Abnormal airspace consolidation within the left lower lobe is  concerning for primary pulmonary neoplasm.  2. Pathologically enlarged mediastinal lymph nodes which are  worrisome for metastatic adenopathy.  30. Small pericardial effusion. Pericardial involvement by tumor  is not excluded.  4. 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  5. Advise further evaluation with PET CT and biopsy to confirm  these imaging findings.  He underwent bronchoscopy on 03-18-12 showing non-necrotizing granuloma and fungal elements.  Was started on diflucan 03-21-12. Had f/u in ID 3-5 and was doing well. He had crypto Ag 1:640. HIV-.  Back to playing sports, softball/golf. Has some DOE but back to baseline. No problems with diflucan. No f/c.  His wt was variable but has since been steady at 201.   Review of Systems     Objective:   Physical Exam  Constitutional: He appears well-developed and well-nourished.  Eyes: EOM are normal. Pupils are equal, round, and reactive to light.  Cardiovascular: Normal rate, regular rhythm and normal heart sounds.   Pulmonary/Chest: Effort normal and breath sounds normal.  Abdominal: Soft. Bowel sounds are normal. There is no tenderness.  Musculoskeletal:       Legs: Lymphadenopathy:    He has no cervical adenopathy.          Assessment & Plan:

## 2012-06-26 NOTE — Assessment & Plan Note (Signed)
He is doing very well. Will plan for him to continue medicine until August 25. F/u with Dr Patsy Lager re: f/u CT scan to see if previous changes have resolved. He can rtc at ID prn.

## 2014-07-24 IMAGING — CR DG CHEST 2V
2 series · 2 of 2 positions shown · non-contrast
Comparison: None.

CLINICAL DATA: 56-year-old male with cough and weight loss.

CHEST - 2 VIEW

[view not recorded (1 of 2)]
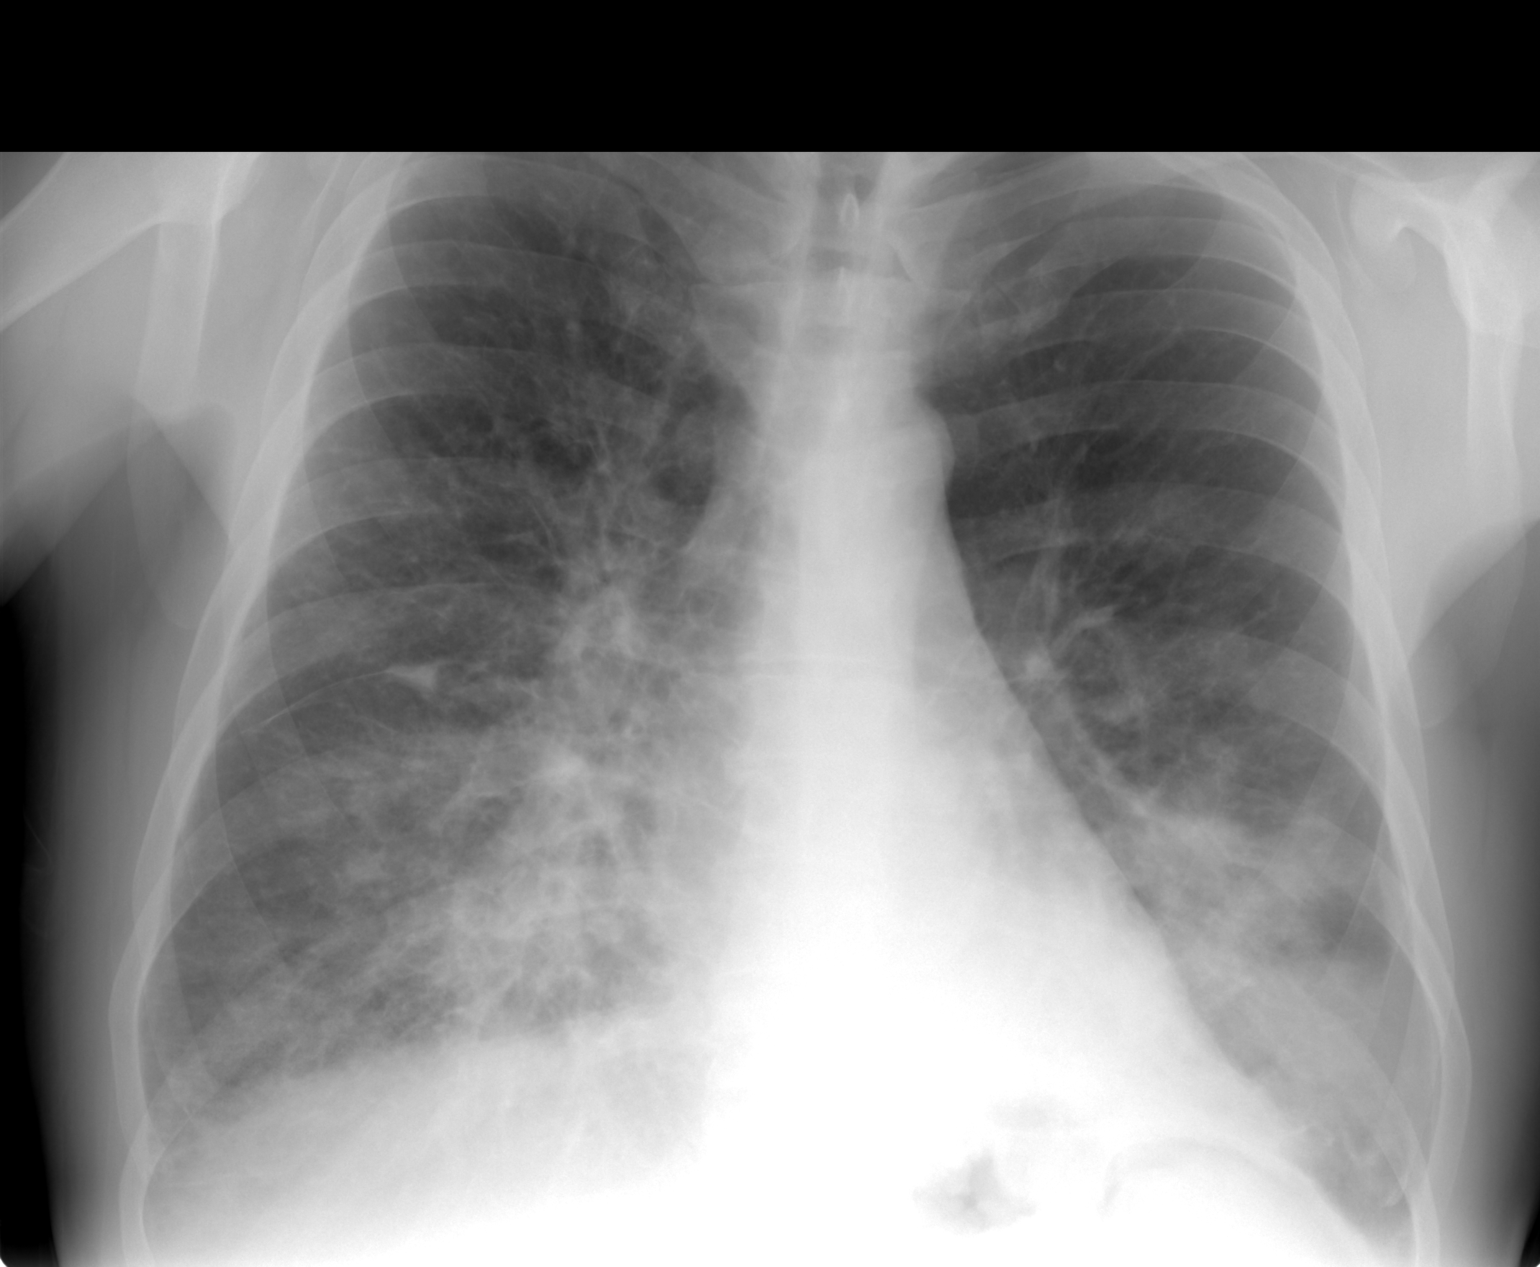

[view not recorded (2 of 2)]
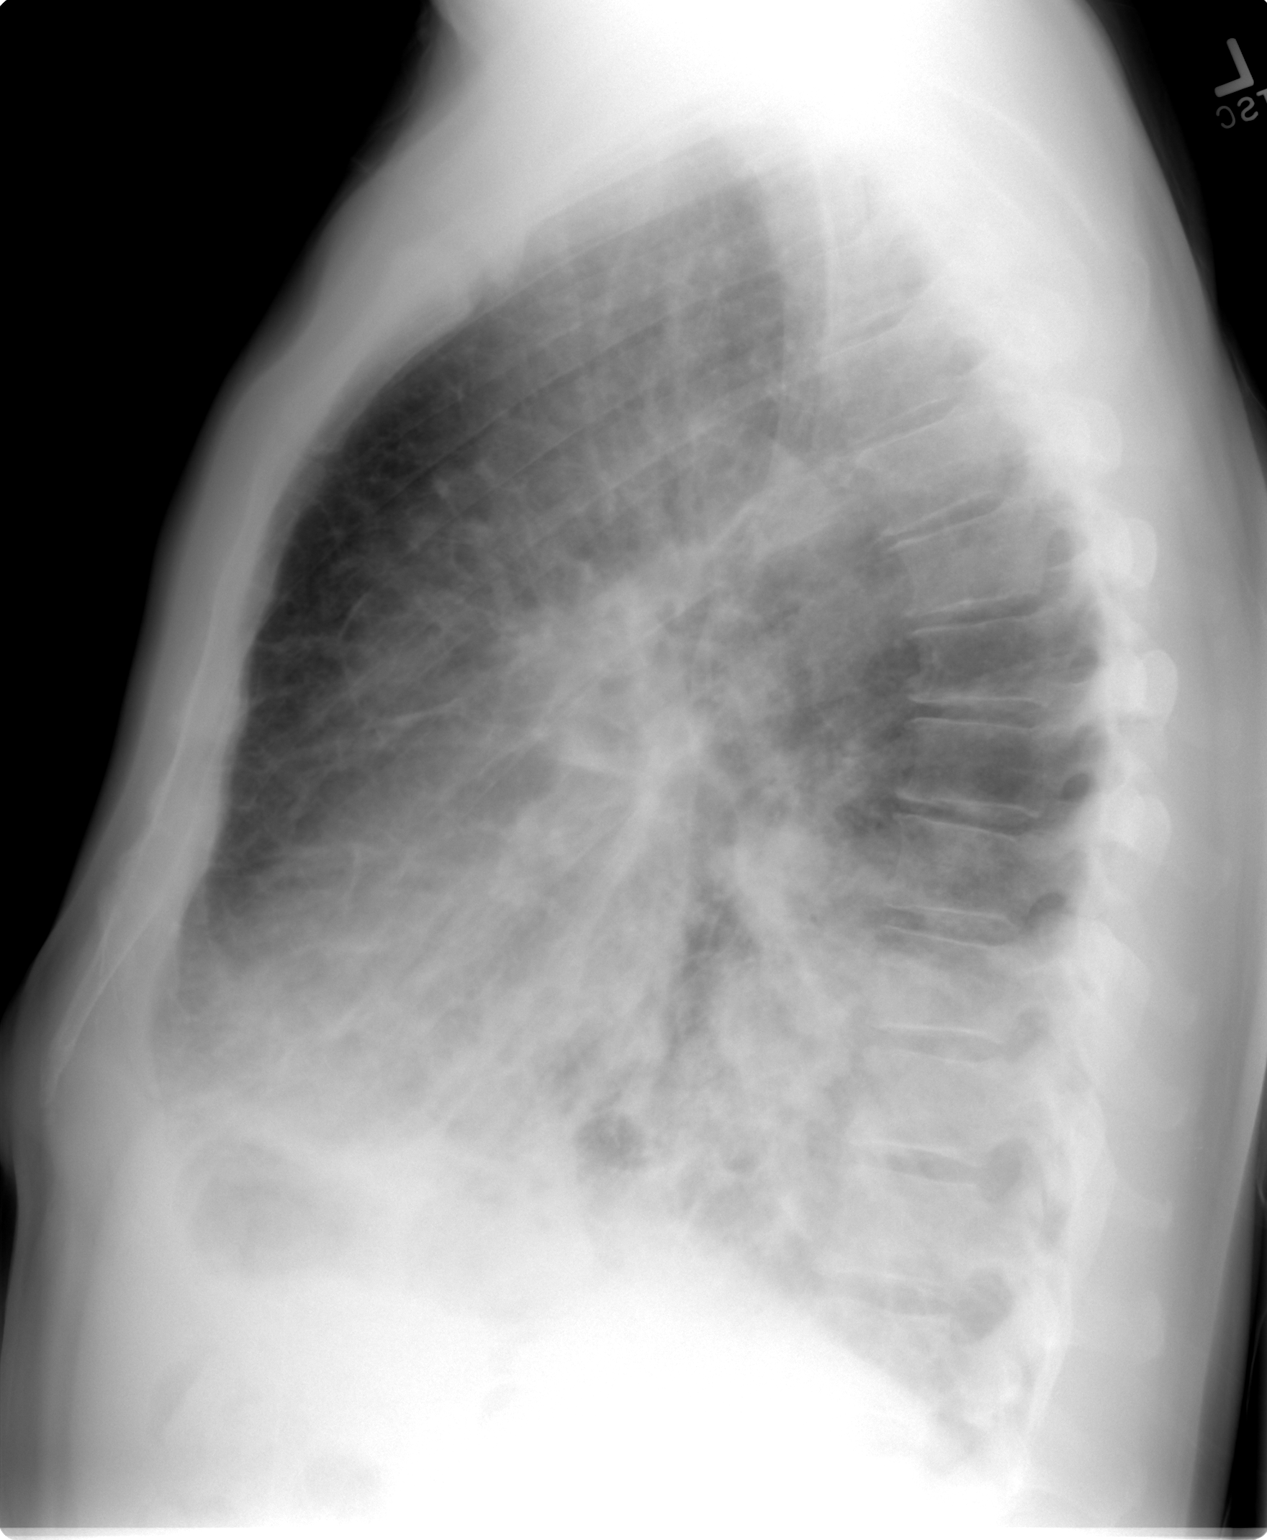

[2 of 2 positions shown; findings below may reference images not displayed]

FINDINGS: Confluent bilateral lower lobe and possibly right middle
lobe pulmonary opacity.  Little if any associated pulmonary volume
loss.  Cardiac size and mediastinal contours are within normal
limits.  Visualized tracheal air column is within normal limits.
Upper lobes appear within normal limits.  No pneumothorax or
pleural effusion.
IMPRESSION: Confluent bilateral lower lobe and possibly also right middle lobe
abnormal pulmonary opacity.  This is nonspecific with top
differential considerations including infection (bilateral
pneumonia), inflammatory process (such as organizing pneumonia), or
neoplastic process (such as bronchoalveolar cell carcinoma).
If there is clinical evidence of acute infectious pneumonia, post-
treatment radiographs are recommended to document resolution.
Otherwise, consider chest CT (IV contrast preferred) or
bronchoscopy to attempt further characterization.

These results will be called to the ordering clinician or
representative by the Radiologist Assistant, and communication
documented in the PACS Dashboard.

## 2014-07-25 IMAGING — CT CT CHEST W/ CM
2 of 4 series · 15 of 36 positions shown, 18 images · IV contrast (Omnipaque 300)
Comparison: 03/10/2012

CLINICAL DATA: Abnormal chest radiograph.

CT CHEST WITH CONTRAST
TECHNIQUE: Multidetector CT imaging of the chest was performed
following the standard protocol during bolus administration of
intravenous contrast.
Contrast: 80mL OMNIPAQUE IOHEXOL 300 MG/ML  SOLN

[Series 2: chest routine with · axial · 0.81mm/px · z∈[-322,-47]mm · 12 of 67 slices shown, 15 images]
[im 6/67  mediastinal]
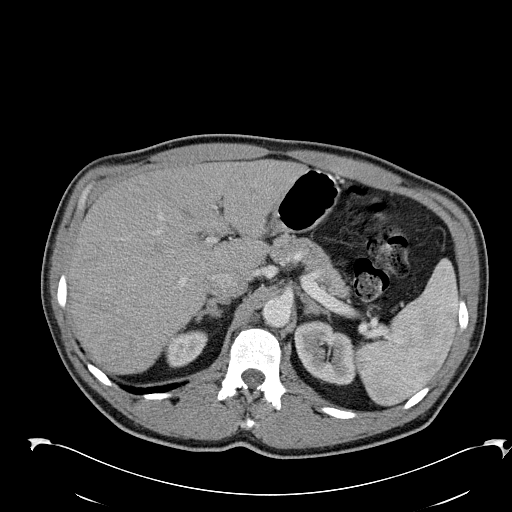
[im 6/67  lung]
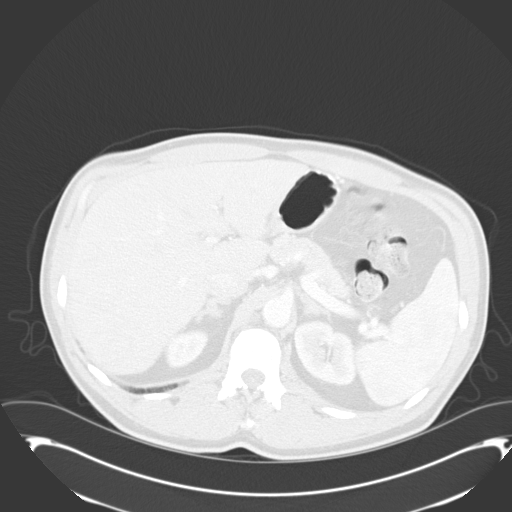
[im 11/67  lung]
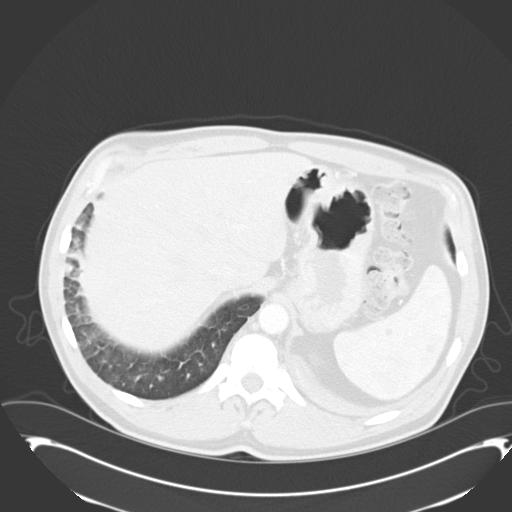
[im 16/67  lung]
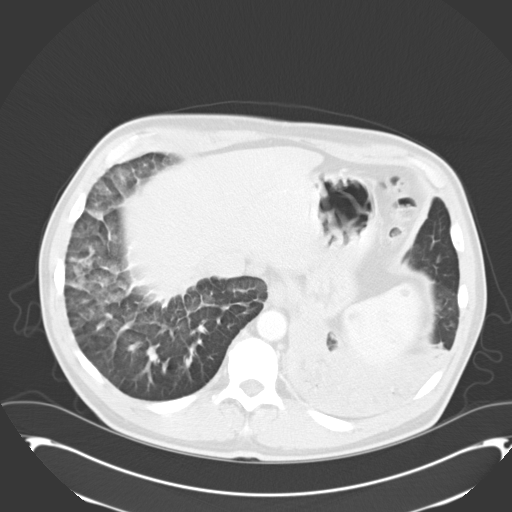
[im 21/67  lung]
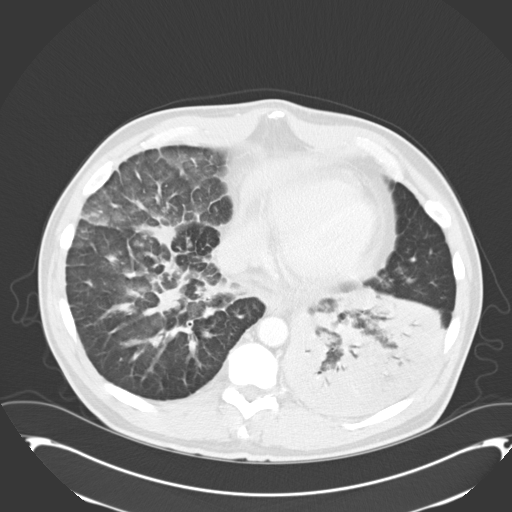
[im 26/67  mediastinal]
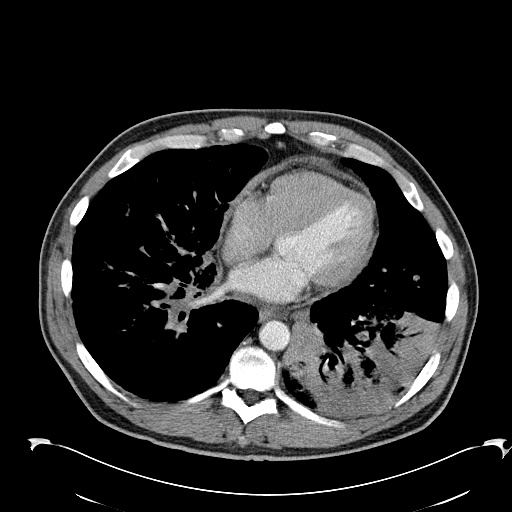
[im 26/67  lung]
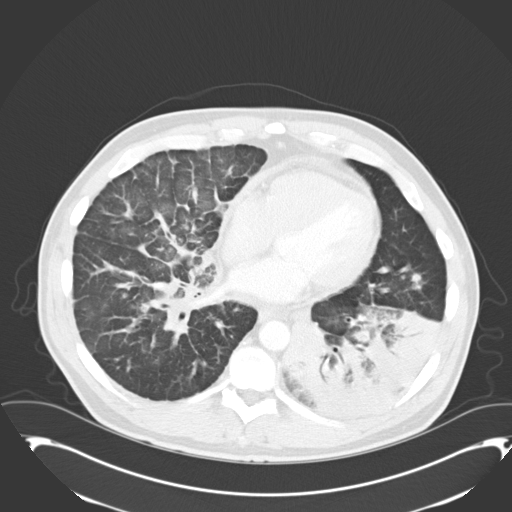
[im 31/67  lung]
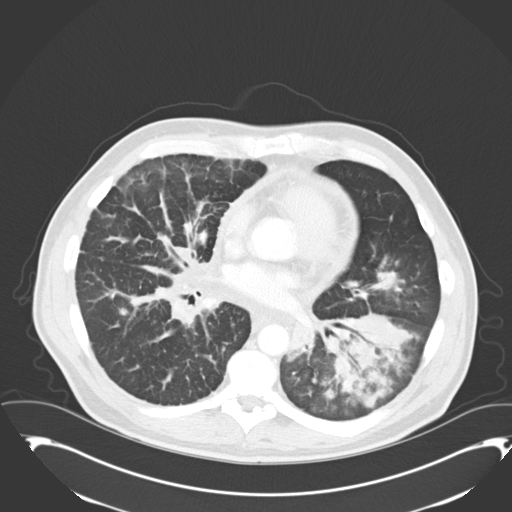
[im 36/67  lung]
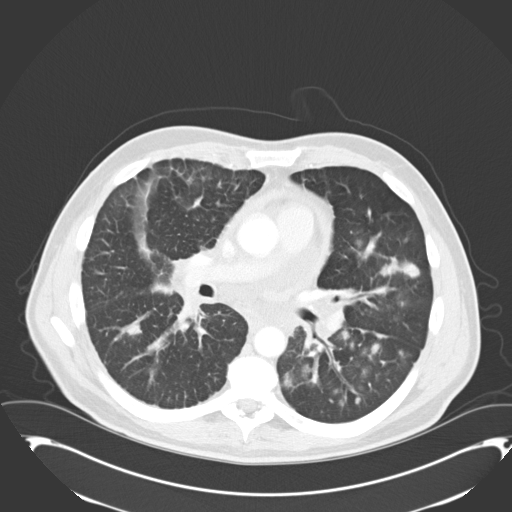
[im 41/67  lung]
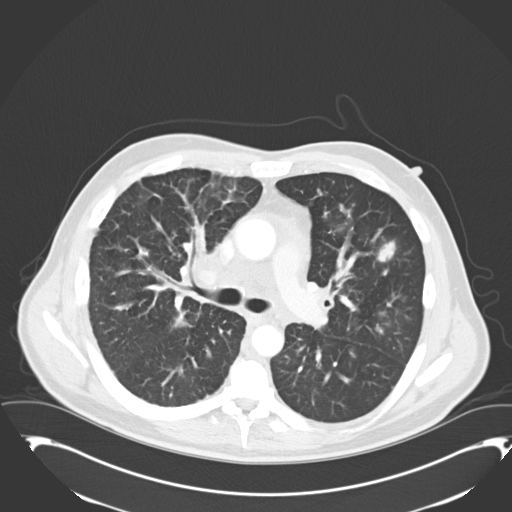
[im 46/67  mediastinal]
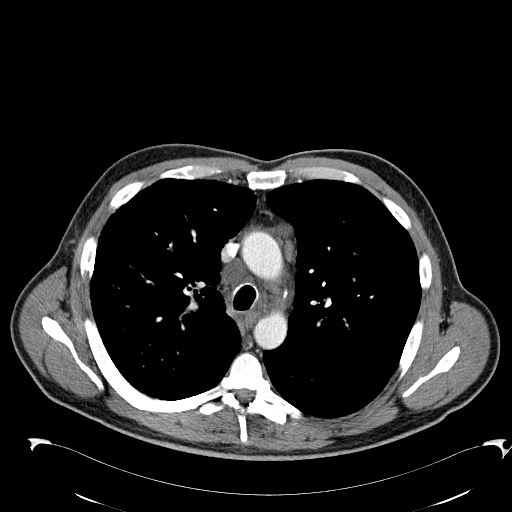
[im 46/67  lung]
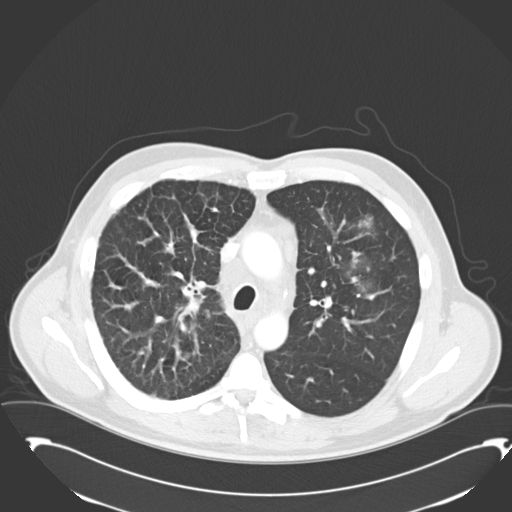
[im 51/67  lung]
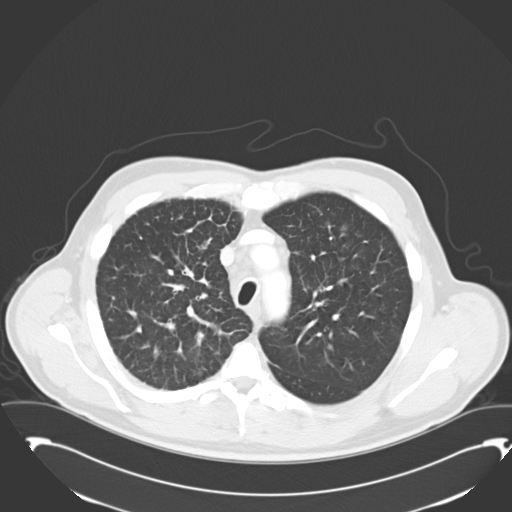
[im 56/67  lung]
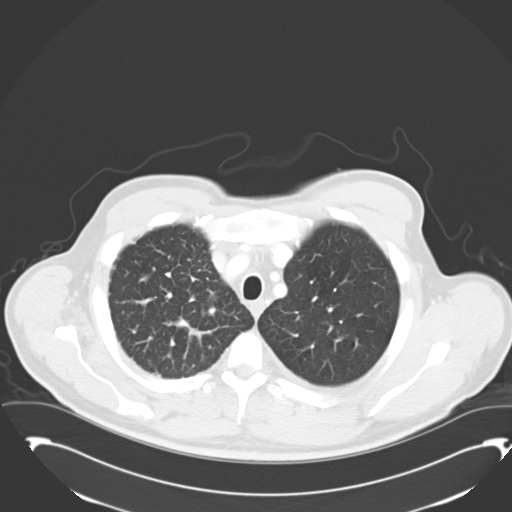
[im 61/67  lung]
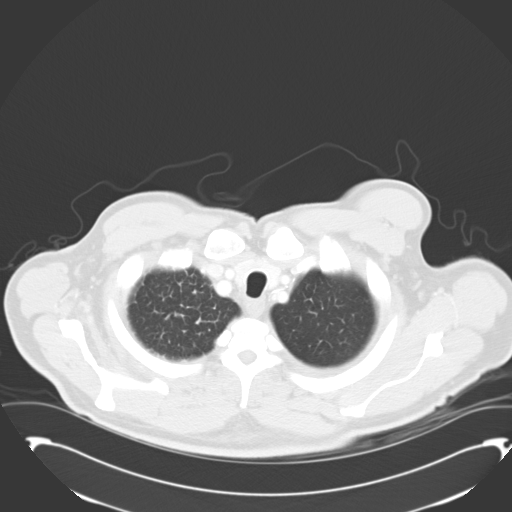

[Series 602: cor · coronal · 0.81mm/px · 3 of 118 slices shown]
[im 24/118  lung]
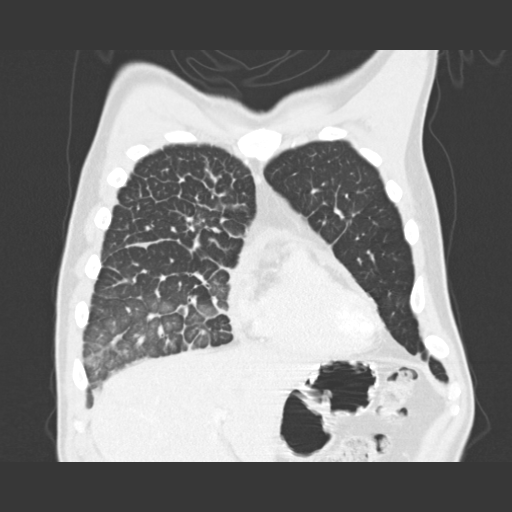
[im 47/118  lung]
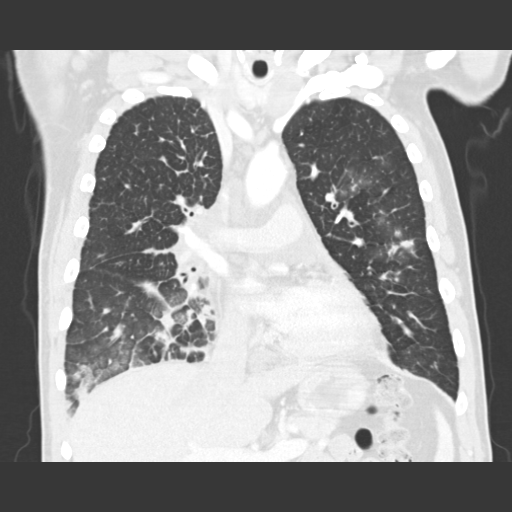
[im 71/118  lung]
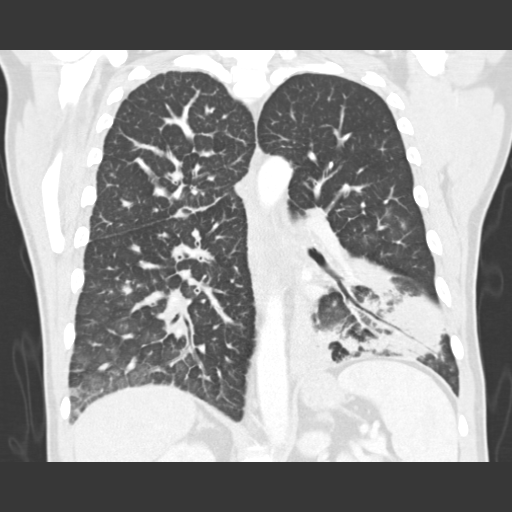

[15 of 36 positions shown; findings below may reference images not displayed]

FINDINGS: No axillary or supraclavicular adenopathy.

Enlarged mediastinal lymph nodes are identified.  Right
paratracheal node measures 1.7 cm.  There is a prevascular lymph
node which measures 1.6 cm, image 23.  Heart size is normal.  There
is a mild pericardial effusion.

 There is dense airspace consolidation involving much of the left
lower lobe which contains some areas of cavitation.  Multifocal
pulmonary nodules are identified in both lungs.  Index nodule in
the left upper lobe measures 1.8 cm, image 27.  Also in the left
upper lobe is a 1.4 cm nodule, image 23.  Index nodule within the
right lower lobe measures 7 mm, image 40/series 3.  Throughout the
right lung there is abnormal inter lobular septal thickening and
peribronchovascular thickening which is concerning for lymphangitic
spread of tumor.

Review of the visualized osseous structures is negative for
aggressive lytic or sclerotic bone lesion.

Limited imaging through the upper abdomen shows no focal liver
abnormalities.  The adrenal glands are unremarkable.  There are
multiple indeterminant low attenuation structures identified within
the spleen.
IMPRESSION: 1.  Abnormal airspace consolidation within the left lower lobe is
concerning for primary pulmonary neoplasm.
2.  Pathologically enlarged mediastinal lymph nodes which are
worrisome for metastatic adenopathy.
30.  Small pericardial effusion.  Pericardial involvement by tumor
is not excluded.
4.  Bilateral pulmonary nodules and evidence of lymphangitic spread
of tumor throughout the right lung.
5.  Advise further evaluation with PET CT and biopsy to confirm
these imaging findings.

These results will be called to the ordering clinician or
representative by the Radiologist Assistant, and communication
documented in the PACS Dashboard.

## 2014-08-01 IMAGING — CR DG CHEST 1V PORT
1 series · 1 of 1 positions shown · non-contrast
Comparison: 03/11/2012

CLINICAL DATA: Status post bronchoscopy

PORTABLE CHEST - 1 VIEW

[AP]
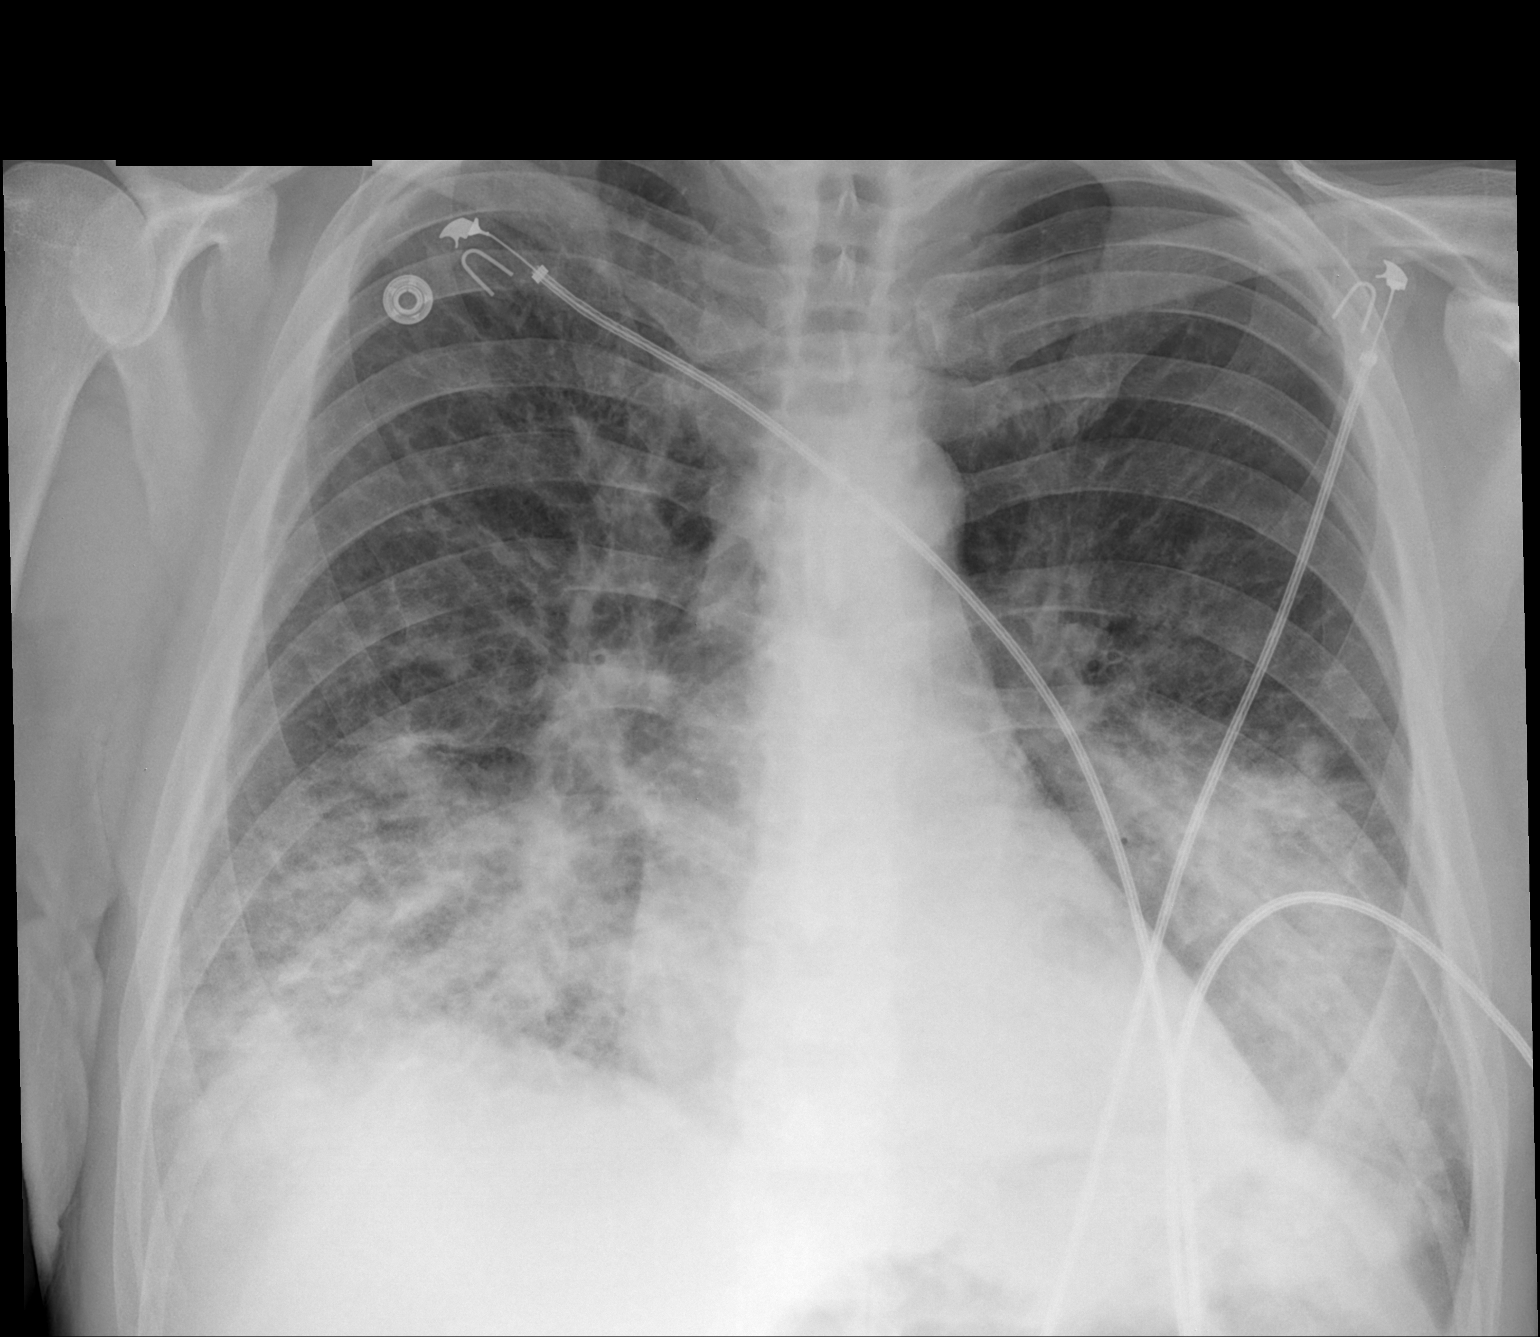

[1 of 1 positions shown; findings below may reference images not displayed]

FINDINGS: Heart size is normal.  Persistent airspace consolidation
within the left lower lobe.  There is been interval worsening of
aeration to the right base.  Mild diffuse interlobular septal
thickening is again noted..
IMPRESSION: 1.  Bilateral lower lobe airspace consolidation.  Compared with
previous exam there is been worsening of aeration to the right
base.
2.  No pneumothorax noted

## 2014-11-29 ENCOUNTER — Encounter: Payer: Self-pay | Admitting: Gastroenterology

## 2015-08-09 ENCOUNTER — Ambulatory Visit (INDEPENDENT_AMBULATORY_CARE_PROVIDER_SITE_OTHER): Payer: BLUE CROSS/BLUE SHIELD | Admitting: Primary Care

## 2015-08-09 ENCOUNTER — Encounter: Payer: Self-pay | Admitting: Primary Care

## 2015-08-09 VITALS — BP 154/100 | HR 64 | Temp 98.2°F | Ht 69.0 in | Wt 233.8 lb

## 2015-08-09 DIAGNOSIS — K4090 Unilateral inguinal hernia, without obstruction or gangrene, not specified as recurrent: Secondary | ICD-10-CM

## 2015-08-09 DIAGNOSIS — D1720 Benign lipomatous neoplasm of skin and subcutaneous tissue of unspecified limb: Secondary | ICD-10-CM

## 2015-08-09 DIAGNOSIS — R030 Elevated blood-pressure reading, without diagnosis of hypertension: Secondary | ICD-10-CM | POA: Diagnosis not present

## 2015-08-09 DIAGNOSIS — IMO0001 Reserved for inherently not codable concepts without codable children: Secondary | ICD-10-CM

## 2015-08-09 DIAGNOSIS — I10 Essential (primary) hypertension: Secondary | ICD-10-CM | POA: Insufficient documentation

## 2015-08-09 NOTE — Assessment & Plan Note (Addendum)
Moderately sized subcutaneous skin mass to right lateral calf representative of lipoma. Non-bothersome. Nontender, does not appear infectious. Recommended no further action at this time.

## 2015-08-09 NOTE — Assessment & Plan Note (Signed)
Above goal in the clinic today, even on recheck. No recent evaluation of blood pressure per patient. Will have him monitor blood pressure over the next month, record readings and bring to upcoming physical. Given family history, if blood pressure remains above goal and we'll initiate treatment. Information provided regarding DASH diet and exercise.

## 2015-08-09 NOTE — Progress Notes (Signed)
Subjective:    Patient ID: Curtis Stewart, male    DOB: 1955-12-20, 60 y.o.   MRN: [id number]  HPI  Curtis Stewart is a 60 year old male who presents today to establish care and discuss the problems mentioned below. Will obtain old records. His last physical was years ago.  1) Elevated Blood Pressure: Above goal in the clinic today at 154/100. He's not  checked his blood pressure in over 4 years. Denies chest pain, dizziness, headaches, lower extremity swelling. He has a family history of hypertension.  2) Skin Mass: Located to right lateral lower extremity that has been present for years. He's noticed gradual, slow, enlargement over the years. Denies pain. This mass does not interfere with his daily life activities. He was told in the past that this was a lipoma.  3) Inguinal Hernia: Diagnosed several years ago and located to the right side. Evaluated in 2014 and hernia was noted to have distended to right testicular sack. He was referred to a general surgeon in 2014 but he never completed this referral due to a lung cancer scare. Over the years he's noticed enlargement to the hernia. Denies difficulty urinating, pain. He is embarrassed by the large mass as it is obvious to the groin region.  Review of Systems  Constitutional: Negative for fever and chills.  Respiratory: Negative for shortness of breath.   Cardiovascular: Negative for chest pain.  Gastrointestinal: Negative for abdominal pain.  Genitourinary: Positive for scrotal swelling.       Inguinal hernia, right  Skin:       Skin mass.  Neurological: Negative for dizziness, numbness and headaches.       Past Medical History  Diagnosis Date  . History of chicken pox   . Inguinal hernia      Social History   Social History  . Marital Status: Single    Spouse Name: N/A  . Number of Children: 2  . Years of Education: N/A   Occupational History  . pressman    Social History Main Topics  . Smoking status: Former Smoker --  1.00 packs/day for 20 years    Types: Cigarettes    Quit date: 11/23/2010  . Smokeless tobacco: Never Used  . Alcohol Use: 0.0 oz/week    0 Standard drinks or equivalent per week     Comment: rarely  . Drug Use: No  . Sexual Activity: Not on file   Other Topics Concern  . Not on file   Social History Narrative   Single.   2 children.    Works in Academic librarian.   Enjoys playing softball, bowling, golfing.     Past Surgical History  Procedure Laterality Date  . Video bronchoscopy Bilateral 03/18/2012    Procedure: VIDEO BRONCHOSCOPY WITH FLUORO;  Surgeon: Tanda Rockers, MD;  Location: [facility];  Service: Cardiopulmonary;  Laterality: Bilateral;    Family History  Problem Relation Age of Onset  . Diabetes Mother   . Diabetes Father   . Emphysema Father   . Cancer Father     bladder  . Hypertension Mother   . Hypertension Father     No Known Allergies  No current outpatient prescriptions on file prior to visit.   No current facility-administered medications on file prior to visit.    BP 154/100 mmHg  Pulse 64  Temp(Src) 98.2 F (36.8 C) (Oral)  Ht 5\' 9"  (1.753 m)  Wt 233 lb 12.8 oz (106.051 kg)  BMI 34.51 kg/m2  SpO2 98%    Objective:   Physical Exam  Constitutional: He appears well-nourished.  Neck: Neck supple.  Cardiovascular: Normal rate and regular rhythm.   Pulmonary/Chest: Effort normal and breath sounds normal.  Genitourinary:  Large mass to right scrotum. Does not appear to be hydrocele. Likely inguinal hernia that has distended.  Skin: Skin is warm and dry.  Moderately sized subcutaneous mass to right lateral calf. Representative of lipoma based off of examination. Nontender, non-erythematous.          Assessment & Plan:

## 2015-08-09 NOTE — Patient Instructions (Signed)
You will be contacted regarding your referral to General Surgery. Please let us know if you have not heard back within one week.   Check your blood pressure 2-3 times weekly, around the same time of day, for the next 1 month. Ensure that you have rested for 30 minutes prior to checking your blood pressure. Record your readings and bring them to your next visit.  Please schedule a physical with me in 1 month. You may also schedule a lab only appointment 3-4 days prior. We will discuss your lab results in detail during your physical.  It was a pleasure to meet you today! Please don't hesitate to call me with any questions. Welcome to [facility]!

## 2015-08-09 NOTE — Progress Notes (Signed)
Pre visit review using our clinic review tool, if applicable. No additional management support is needed unless otherwise documented below in the visit note. 

## 2015-08-09 NOTE — Assessment & Plan Note (Addendum)
Located to right lower side, now with right scrotal involvement. Scrotum is quite enlarged and very obvious underclothing. Nontender. Does not appear to be hydrocele. Given size and duration of mass will send to general surgery for further evaluation and possibly surgery.

## 2015-08-15 ENCOUNTER — Encounter: Payer: Self-pay | Admitting: *Deleted

## 2015-08-22 ENCOUNTER — Ambulatory Visit (INDEPENDENT_AMBULATORY_CARE_PROVIDER_SITE_OTHER): Payer: BLUE CROSS/BLUE SHIELD | Admitting: General Surgery

## 2015-08-22 ENCOUNTER — Encounter: Payer: Self-pay | Admitting: General Surgery

## 2015-08-22 ENCOUNTER — Other Ambulatory Visit: Payer: Self-pay | Admitting: General Surgery

## 2015-08-22 VITALS — BP 160/82 | HR 82 | Resp 12 | Ht 71.0 in | Wt 229.0 lb

## 2015-08-22 DIAGNOSIS — Z1211 Encounter for screening for malignant neoplasm of colon: Secondary | ICD-10-CM | POA: Diagnosis not present

## 2015-08-22 DIAGNOSIS — D1723 Benign lipomatous neoplasm of skin and subcutaneous tissue of right leg: Secondary | ICD-10-CM | POA: Diagnosis not present

## 2015-08-22 DIAGNOSIS — K4090 Unilateral inguinal hernia, without obstruction or gangrene, not specified as recurrent: Secondary | ICD-10-CM | POA: Diagnosis not present

## 2015-08-22 DIAGNOSIS — Z86010 Personal history of colonic polyps: Secondary | ICD-10-CM

## 2015-08-22 MED ORDER — POLYETHYLENE GLYCOL 3350 17 GM/SCOOP PO POWD: 1.0000 | Freq: Once | ORAL | 0 refills | Status: AC

## 2015-08-22 NOTE — H&P (Signed)
Patient ID: JOHNAS CATTS, male   DOB: 04-Jun-1955, 60 y.o.   MRN: [id number]  Chief Complaint  Patient presents with  . Other    hernia    HPI Curtis Stewart is a 60 y.o. male here today for an evalution of a right inguinal hernia. Patient states he first noticed this about 2 years. He states it does pop in and out when he is relaxed or laying down. Denies pain/tenderness. He states he plays softball it is in the way when he is playing. He has a large lipoma right lateral lower leg present for about 6 years. Moves his bowels regularly.   HPI  Past Medical History:  Diagnosis Date  . History of chicken pox   . Inguinal hernia     Past Surgical History:  Procedure Laterality Date  . [place]COPY  2011   [place]   . VIDEO BRONCHOSCOPY Bilateral 03/18/2012   Procedure: VIDEO BRONCHOSCOPY WITH FLUORO;  Surgeon: Tanda Rockers, MD;  Location: [facility];  Service: Cardiopulmonary;  Laterality: Bilateral;    Family History  Problem Relation Age of Onset  . Diabetes Mother   . Hypertension Mother   . Diabetes Father   . Emphysema Father   . Cancer Father     bladder  . Hypertension Father     Social History Social History  Substance Use Topics  . Smoking status: Former Smoker    Packs/day: 1.00    Years: 20.00    Types: Cigarettes    Quit date: 11/23/2010  . Smokeless tobacco: Never Used  . Alcohol use 0.0 oz/week     Comment: rarely    No Known Allergies  Current Outpatient Prescriptions  Medication Sig Dispense Refill  . polyethylene glycol powder (GLYCOLAX/MIRALAX) powder Take 255 g by mouth once. 255 g 0   No current facility-administered medications for this visit.     Review of Systems Review of Systems  Constitutional: Negative.   Respiratory: Negative.   Cardiovascular: Negative.     Blood pressure (!) 160/82, pulse 82, resp. rate 12, height 5\' 11"  (1.803 m), weight 229 lb (103.9 kg).  Physical Exam Physical Exam  Constitutional: He is oriented  to person, place, and time. He appears well-developed and well-nourished.  HENT:  Head:    Eyes: Conjunctivae are normal. No scleral icterus.  Neck: Neck supple.  Cardiovascular: Normal rate, regular rhythm and normal heart sounds.   Pulmonary/Chest: Effort normal and breath sounds normal.  Abdominal: Soft. Bowel sounds are normal. A hernia is present. Hernia confirmed positive in the right inguinal area (large scrotal hernia).  Genitourinary:     Musculoskeletal:       Legs: Lymphadenopathy:    He has no cervical adenopathy.  Neurological: He is alert and oriented to person, place, and time.  Skin: Skin is warm and dry.  9x10 cm lipoma right lateral calf     Data Reviewed Pathology from the 02/21/2009 colonoscopy showed a tubular adenomas removed from the colon, majority on the right side. High-grade dysplasia noted.   Assessment    Past history multiple colonic tubular adenomas. Follow-up exam due in 2014.  Large, stable by patient report, right calf mass consistent with lipoma, size warranting excision to rule out liposarcoma.  Large right scrotal hernia.      Plan    Colonoscopy would be the first step. This could be completed locally or he could return to [place]. At this time he's planning to have a completed locally.  He  normally placed softball in the fall. If the hernias can be fixed he will have to put himself on the bench. Would recommend hernia repair with mesh for reinforcement based on its large size.  Right lower extremity lipoma can be excised at the time of his hernia repair.  The patient reports that the dermal lesion on the left cheek is unchanged over decades and will be observed.     Colonoscopy with possible biopsy/polypectomy prn: Information regarding the procedure, including its potential risks and complications (including but not limited to perforation of the bowel, which may require emergency surgery to repair, and bleeding) was verbally  given to the patient. Educational information regarding lower intestinal endoscopy was given to the patient. Written instructions for how to complete the bowel prep using Miralax were provided. The importance of drinking ample fluids to avoid dehydration as a result of the prep emphasized.   Hernia precautions and incarceration were discussed with the patient. If they develop symptoms of an incarcerated hernia, they were encouraged to seek prompt medical attention.  I have recommended repair of the hernia using mesh on an outpatient basis in the near future. The risk of infection was reviewed. The role of prosthetic mesh to minimize the risk of recurrence was reviewed.  Patient advised on recommendations of repair while not playing softball this season.  Advised on the importance of scheduling a colonoscopy to follow up for previous polyps found.   The patient is scheduled for a Colonoscopy at [facility] on 09/20/15. They are aware to call the day before to get their arrival time. Miralax prescription has been sent into the patient's pharmacy. The patient is aware of date and instructions.  The patient is scheduled for surgery at [facility] on 10/27/15. He will pre admit by phone. The patient is aware of date and instructions.   Curtis Stewart 08/22/2015, 10:10 PM

## 2015-08-22 NOTE — Patient Instructions (Addendum)
Inguinal Hernia, Adult Muscles help keep everything in the body in its proper place. But if a weak spot in the muscles develops, something can poke through. That is called a hernia. When this happens in the lower part of the belly (abdomen), it is called an inguinal hernia. (It takes its name from a part of the body in this region called the inguinal canal.) A weak spot in the wall of muscles lets some fat or part of the small intestine bulge through. An inguinal hernia can develop at any age. Men get them more often than women. CAUSES  In adults, an inguinal hernia develops over time.  It can be triggered by:  Suddenly straining the muscles of the lower abdomen.  Lifting heavy objects.  Straining to have a bowel movement. Difficult bowel movements (constipation) can lead to this.  Constant coughing. This may be caused by smoking or lung disease.  Being overweight.  Being pregnant.  Working at a job that requires long periods of standing or heavy lifting.  Having had an inguinal hernia before. One type can be an emergency situation. It is called a strangulated inguinal hernia. It develops if part of the small intestine slips through the weak spot and cannot get back into the abdomen. The blood supply can be cut off. If that happens, part of the intestine may die. This situation requires emergency surgery. SYMPTOMS  Often, a small inguinal hernia has no symptoms. It is found when a healthcare provider does a physical exam. Larger hernias usually have symptoms.   In adults, symptoms may include:  A lump in the groin. This is easier to see when the person is standing. It might disappear when lying down.  In men, a lump in the scrotum.  Pain or burning in the groin. This occurs especially when lifting, straining or coughing.  A dull ache or feeling of pressure in the groin.  Signs of a strangulated hernia can include:  A bulge in the groin that becomes very painful and tender to the  touch.  A bulge that turns red or purple.  Fever, nausea and vomiting.  Inability to have a bowel movement or to pass gas. DIAGNOSIS  To decide if you have an inguinal hernia, a healthcare provider will probably do a physical examination.  This will include asking questions about any symptoms you have noticed.  The healthcare provider might feel the groin area and ask you to cough. If an inguinal hernia is felt, the healthcare provider may try to slide it back into the abdomen.  Usually no other tests are needed. TREATMENT  Treatments can vary. The size of the hernia makes a difference. Options include:  Watchful waiting. This is often suggested if the hernia is small and you have had no symptoms.  No medical procedure will be done unless symptoms develop.  You will need to watch closely for symptoms. If any occur, contact your healthcare provider right away.  Surgery. This is used if the hernia is larger or you have symptoms.  Open surgery. This is usually an outpatient procedure (you will not stay overnight in a hospital). An cut (incision) is made through the skin in the groin. The hernia is put back inside the abdomen. The weak area in the muscles is then repaired by herniorrhaphy or hernioplasty. Herniorrhaphy: in this type of surgery, the weak muscles are sewn back together. Hernioplasty: a patch or mesh is used to close the weak area in the abdominal wall.  Laparoscopy.   In this procedure, a surgeon makes small incisions. A thin tube with a tiny video camera (called a laparoscope) is put into the abdomen. The surgeon repairs the hernia with mesh by looking with the video camera and using two long instruments. HOME CARE INSTRUCTIONS   After surgery to repair an inguinal hernia:  You will need to take pain medicine prescribed by your healthcare provider. Follow all directions carefully.  You will need to take care of the wound from the incision.  Your activity will be  restricted for awhile. This will probably include no heavy lifting for several weeks. You also should not do anything too active for a few weeks. When you can return to work will depend on the type of job that you have.  During "watchful waiting" periods, you should:  Maintain a healthy weight.  Eat a diet high in fiber (fruits, vegetables and whole grains).  Drink plenty of fluids to avoid constipation. This means drinking enough water and other liquids to keep your urine clear or pale yellow.  Do not lift heavy objects.  Do not stand for long periods of time.  Quit smoking. This should keep you from developing a frequent cough. SEEK MEDICAL CARE IF:   A bulge develops in your groin area.  You feel pain, a burning sensation or pressure in the groin. This might be worse if you are lifting or straining.  You develop a fever of more than 100.5 F (38.1 C). SEEK IMMEDIATE MEDICAL CARE IF:   Pain in the groin increases suddenly.  A bulge in the groin gets bigger suddenly and does not go down.  For men, there is sudden pain in the scrotum. Or, the size of the scrotum increases.  A bulge in the groin area becomes red or purple and is painful to touch.  You have nausea or vomiting that does not go away.  You feel your heart beating much faster than normal.  You cannot have a bowel movement or pass gas.  You develop a fever of more than 102.0 F (38.9 C).   This information is not intended to replace advice given to you by your health care provider. Make sure you discuss any questions you have with your health care provider.   Document Released: 05/27/2008 Document Revised: 04/02/2011 Document Reviewed: 07/12/2014 Elsevier Interactive Patient Education 2016 [employer].  Colonoscopy A colonoscopy is an exam to look at the entire large intestine (colon). This exam can help find problems such as tumors, polyps, inflammation, and areas of bleeding. The exam takes about 1 hour.    LET [facility] CARE PROVIDER KNOW ABOUT:   Any allergies you have.  All medicines you are taking, including vitamins, herbs, eye drops, creams, and over-the-counter medicines.  Previous problems you or members of your family have had with the use of anesthetics.  Any blood disorders you have.  Previous surgeries you have had.  Medical conditions you have. RISKS AND COMPLICATIONS  Generally, this is a safe procedure. However, as with any procedure, complications can occur. Possible complications include:  Bleeding.  Tearing or rupture of the colon wall.  Reaction to medicines given during the exam.  Infection (rare). BEFORE THE PROCEDURE   Ask your health care provider about changing or stopping your regular medicines.  You may be prescribed an oral bowel prep. This involves drinking a large amount of medicated liquid, starting the day before your procedure. The liquid will cause you to have multiple loose stools until your  stool is almost clear or light green. This cleans out your colon in preparation for the procedure.  Do not eat or drink anything else once you have started the bowel prep, unless your health care provider tells you it is safe to do so.  Arrange for someone to drive you home after the procedure. PROCEDURE   You will be given medicine to help you relax (sedative).  You will lie on your side with your knees bent.  A long, flexible tube with a light and camera on the end (colonoscope) will be inserted through the rectum and into the colon. The camera sends video back to a computer screen as it moves through the colon. The colonoscope also releases carbon dioxide gas to inflate the colon. This helps your health care provider see the area better.  During the exam, your health care provider may take a small tissue sample (biopsy) to be examined under a microscope if any abnormalities are found.  The exam is finished when the entire colon has been  viewed. AFTER THE PROCEDURE   Do not drive for 24 hours after the exam.  You may have a small amount of blood in your stool.  You may pass moderate amounts of gas and have mild abdominal cramping or bloating. This is caused by the gas used to inflate your colon during the exam.  Ask when your test results will be ready and how you will get your results. Make sure you get your test results.   This information is not intended to replace advice given to you by your health care provider. Make sure you discuss any questions you have with your health care provider.   Document Released: 01/06/2000 Document Revised: 10/29/2012 Document Reviewed: 09/15/2012 Elsevier Interactive Patient Education [employer].  The patient is scheduled for a Colonoscopy at [facility] on 09/20/15. They are aware to call the day before to get their arrival time. Miralax prescription has been sent into the patient's pharmacy. The patient is aware of date and instructions.  The patient is scheduled for surgery at [facility] on 10/27/15. He will pre admit by phone. The patient is aware of date and instructions.

## 2015-09-01 ENCOUNTER — Other Ambulatory Visit: Payer: Self-pay | Admitting: Primary Care

## 2015-09-01 DIAGNOSIS — Z125 Encounter for screening for malignant neoplasm of prostate: Secondary | ICD-10-CM

## 2015-09-01 DIAGNOSIS — Z0000 Encounter for general adult medical examination without abnormal findings: Secondary | ICD-10-CM

## 2015-09-01 DIAGNOSIS — Z1159 Encounter for screening for other viral diseases: Secondary | ICD-10-CM

## 2015-09-05 ENCOUNTER — Other Ambulatory Visit (INDEPENDENT_AMBULATORY_CARE_PROVIDER_SITE_OTHER): Payer: BLUE CROSS/BLUE SHIELD

## 2015-09-05 DIAGNOSIS — Z0000 Encounter for general adult medical examination without abnormal findings: Secondary | ICD-10-CM

## 2015-09-05 DIAGNOSIS — Z125 Encounter for screening for malignant neoplasm of prostate: Secondary | ICD-10-CM | POA: Diagnosis not present

## 2015-09-05 DIAGNOSIS — Z1159 Encounter for screening for other viral diseases: Secondary | ICD-10-CM

## 2015-09-05 LAB — COMPREHENSIVE METABOLIC PANEL
ALBUMIN: 3.9 g/dL (ref 3.5–5.2)
ALK PHOS: 100 U/L (ref 39–117)
ALT: 25 U/L (ref 0–53)
AST: 18 U/L (ref 0–37)
BILIRUBIN TOTAL: 0.6 mg/dL (ref 0.2–1.2)
BUN: 20 mg/dL (ref 6–23)
CALCIUM: 9.4 mg/dL (ref 8.4–10.5)
CHLORIDE: 103 meq/L (ref 96–112)
CO2: 31 mEq/L (ref 19–32)
CREATININE: 1.24 mg/dL (ref 0.40–1.50)
GFR: 63.33 mL/min (ref >60.00)
Glucose, Bld: 105 mg/dL — ABNORMAL HIGH (ref 70–99)
Potassium: 5.1 mEq/L (ref 3.5–5.1)
Sodium: 139 mEq/L (ref 135–145)
TOTAL PROTEIN: 6.5 g/dL (ref 6.0–8.3)

## 2015-09-05 LAB — LIPID PANEL
CHOLESTEROL: 186 mg/dL (ref 0–200)
HDL: 32 mg/dL — ABNORMAL LOW (ref >39.00)
NonHDL: 153.65
Total CHOL/HDL Ratio: 6
Triglycerides: 265 mg/dL — ABNORMAL HIGH (ref 0.0–149.0)
VLDL: 53 mg/dL — ABNORMAL HIGH (ref 0.0–40.0)

## 2015-09-05 LAB — LDL CHOLESTEROL, DIRECT: LDL DIRECT: 104 mg/dL

## 2015-09-05 LAB — PSA: PSA: 0.39 ng/mL (ref 0.10–4.00)

## 2015-09-05 LAB — HEMOGLOBIN A1C: HEMOGLOBIN A1C: 5.3 % (ref 4.6–6.5)

## 2015-09-06 LAB — HEPATITIS C ANTIBODY: HCV AB: NEGATIVE

## 2015-09-15 ENCOUNTER — Ambulatory Visit (INDEPENDENT_AMBULATORY_CARE_PROVIDER_SITE_OTHER): Payer: BLUE CROSS/BLUE SHIELD | Admitting: Primary Care

## 2015-09-15 ENCOUNTER — Encounter: Payer: Self-pay | Admitting: Primary Care

## 2015-09-15 VITALS — BP 172/98 | HR 60 | Temp 97.9°F | Ht 71.0 in | Wt 232.8 lb

## 2015-09-15 DIAGNOSIS — R6889 Other general symptoms and signs: Secondary | ICD-10-CM

## 2015-09-15 DIAGNOSIS — K4090 Unilateral inguinal hernia, without obstruction or gangrene, not specified as recurrent: Secondary | ICD-10-CM | POA: Diagnosis not present

## 2015-09-15 DIAGNOSIS — Z0000 Encounter for general adult medical examination without abnormal findings: Secondary | ICD-10-CM | POA: Diagnosis not present

## 2015-09-15 DIAGNOSIS — D1720 Benign lipomatous neoplasm of skin and subcutaneous tissue of unspecified limb: Secondary | ICD-10-CM

## 2015-09-15 DIAGNOSIS — E781 Pure hyperglyceridemia: Secondary | ICD-10-CM

## 2015-09-15 DIAGNOSIS — Z23 Encounter for immunization: Secondary | ICD-10-CM | POA: Diagnosis not present

## 2015-09-15 DIAGNOSIS — I10 Essential (primary) hypertension: Secondary | ICD-10-CM | POA: Diagnosis not present

## 2015-09-15 DIAGNOSIS — Z0001 Encounter for general adult medical examination with abnormal findings: Secondary | ICD-10-CM

## 2015-09-15 MED ORDER — LISINOPRIL 20 MG PO TABS: 20.0000 mg | ORAL_TABLET | Freq: Every day | ORAL | 1 refills | Status: DC

## 2015-09-15 NOTE — Progress Notes (Signed)
Pre visit review using our clinic review tool, if applicable. No additional management support is needed unless otherwise documented below in the visit note. 

## 2015-09-15 NOTE — Assessment & Plan Note (Signed)
Tetanus due, provided today. Colonoscopy due, scheduled for later this month. Exam today stable. Pending surgery for lipoma removal and hernia repair in October. Labs with hypertriglyceridemia which was addressed. BP above goal in clinic again, treated with Lisinopril. Discussed the importance of a healthy diet and regular exercise in order for weight loss to reduce risk of other medical diseases. Follow up in 2-3 weeks for re-evaluation of BP, 1 year for repeat physical.

## 2015-09-15 NOTE — Assessment & Plan Note (Signed)
Pending surgery in October 2017.

## 2015-09-15 NOTE — Addendum Note (Signed)
Addended by: Jacqualin Combes on: 09/15/2015 11:01 AM   Modules accepted: Orders

## 2015-09-15 NOTE — Patient Instructions (Addendum)
Start Lisinopril 20 mg tablet for high blood pressure. Take 1 tablet by mouth once daily.  Check your blood pressure daily, around the same time of day, for the next 2-3 weeks.   Ensure that you have rested for 30 minutes prior to checking your blood pressure. Record your readings and bring them to your next visit.  Your triglycerides are too high. Start Fish Oil 1000 mg capsules. Take 1 capsule by mouth once daily.  It is important that you improve your diet. Please limit carbohydrates in the form of white bread, rice, pasta, fast food, fried food, sugary drinks, etc. Increase your consumption of fresh fruits and vegetables, whole grains, lean protein.  Ensure you are consuming 64 ounces of water daily.  Start exercising. You should be getting 1 hour of moderate intensity exercise 3-5 days weekly.  Follow up in 2-3 weeks, or when first available for follow up of blood pressure.  It was a pleasure to see you today!  Food Choices to Lower Your Triglycerides Triglycerides are a type of fat in your blood. High levels of triglycerides can increase the risk of heart disease and stroke. If your triglyceride levels are high, the foods you eat and your eating habits are very important. Choosing the right foods can help lower your triglycerides.  WHAT GENERAL GUIDELINES DO I NEED TO FOLLOW?  Lose weight if you are overweight.   Limit or avoid alcohol.   Fill one half of your plate with vegetables and green salads.   Limit fruit to two servings a day. Choose fruit instead of juice.   Make one fourth of your plate whole grains. Look for the word "whole" as the first word in the ingredient list.  Fill one fourth of your plate with lean protein foods.  Enjoy fatty fish (such as salmon, mackerel, sardines, and tuna) three times a week.   Choose healthy fats.   Limit foods high in starch and sugar.  Eat more home-cooked food and less restaurant, buffet, and fast food.  Limit fried  foods.  Cook foods using methods other than frying.  Limit saturated fats.  Check ingredient lists to avoid foods with partially hydrogenated oils (trans fats) in them. WHAT FOODS CAN I EAT?  Grains Whole grains, such as whole wheat or whole grain breads, crackers, cereals, and pasta. Unsweetened oatmeal, bulgur, barley, quinoa, or brown rice. Corn or whole wheat flour tortillas.  Vegetables Fresh or frozen vegetables (raw, steamed, roasted, or grilled). Green salads. Fruits All fresh, canned (in natural juice), or frozen fruits. Meat and Other Protein Products Ground beef (85% or leaner), grass-fed beef, or beef trimmed of fat. Skinless chicken or [place]. Ground chicken or [place]. Pork trimmed of fat. All fish and seafood. Eggs. Dried beans, peas, or lentils. Unsalted nuts or seeds. Unsalted canned or dry beans. Dairy Low-fat dairy products, such as skim or 1% milk, 2% or reduced-fat cheeses, low-fat ricotta or cottage cheese, or plain low-fat yogurt. Fats and Oils Tub margarines without trans fats. Light or reduced-fat mayonnaise and salad dressings. Avocado. Safflower, olive, or canola oils. Natural peanut or almond butter. The items listed above may not be a complete list of recommended foods or beverages. Contact your dietitian for more options. WHAT FOODS ARE NOT RECOMMENDED?  Grains White bread. White pasta. White rice. Cornbread. Bagels, pastries, and croissants. Crackers that contain trans fat. Vegetables White potatoes. Corn. Creamed or fried vegetables. Vegetables in a cheese sauce. Fruits Dried fruits. Canned fruit in light or heavy  syrup. Fruit juice. Meat and Other Protein Products Fatty cuts of meat. Ribs, chicken wings, bacon, sausage, bologna, salami, chitterlings, fatback, hot dogs, bratwurst, and packaged luncheon meats. Dairy Whole or 2% milk, cream, half-and-half, and cream cheese. Whole-fat or sweetened yogurt. Full-fat cheeses. Nondairy creamers and whipped  toppings. Processed cheese, cheese spreads, or cheese curds. Sweets and Desserts Corn syrup, sugars, honey, and molasses. Candy. Jam and jelly. Syrup. Sweetened cereals. Cookies, pies, cakes, donuts, muffins, and ice cream. Fats and Oils Butter, stick margarine, lard, shortening, ghee, or bacon fat. Coconut, palm kernel, or palm oils. Beverages Alcohol. Sweetened drinks (such as sodas, lemonade, and fruit drinks or punches). The items listed above may not be a complete list of foods and beverages to avoid. Contact your dietitian for more information.   This information is not intended to replace advice given to you by your health care provider. Make sure you discuss any questions you have with your health care provider.   Document Released: 10/27/2003 Document Revised: 01/29/2014 Document Reviewed: 11/12/2012 Elsevier Interactive Patient Education [employer].

## 2015-09-15 NOTE — Assessment & Plan Note (Signed)
Above goal in clinic today. Start Lisinopril 20 mg tablets once daily. Baseline BMP stable. Patient to monitor BP over next 2-3 weeks, return at that time with BP logs and for re-evaluation.

## 2015-09-15 NOTE — Progress Notes (Signed)
Subjective:    Patient ID: Curtis Stewart, male    DOB: 1955/12/25, 60 y.o.   MRN: [id number]  HPI  Curtis Stewart is a 60 year old male who presents today for complete physical.  1) Essential Hypertension: Presented as a new patient in July 2017 with a BP above goal at 154/100. He had not checked his BP in 4 years and had no recent reading. He has a strong family history of hypertension. He was provided information regarding a DASH diet and recommendations for daily exercise. He was supposed to monitor his BP over the past 1 month.   His BP in the office today is above goal at 172/98. He did check his BP this morning which was 167/100's. Denies headaches, fatigue, chest pain.  Immunizations: -Tetanus: Completed in 2005, due today. -Influenza: Did not complete last season.   Diet: He endorses a poor diet. Breakfast: Fast food Lunch: Skips, crackers Dinner: Chicken, fast food, restaurants Snacks: Crackers Desserts: Occasionally  Beverages: [employer], water  Exercise: He plays softball, he bowls and plays golf. Eye exam: Completed several years ago. Dental exam: Has not completed in several years. Colonoscopy: Completed in 2011, due and is scheduled.   Review of Systems  Constitutional: Negative for unexpected weight change.  HENT: Negative for rhinorrhea.   Respiratory: Negative for cough and shortness of breath.   Cardiovascular: Negative for chest pain.  Gastrointestinal: Negative for constipation and diarrhea.  Genitourinary: Negative for difficulty urinating.       Right inguinal hernia  Musculoskeletal: Negative for arthralgias and myalgias.  Skin: Negative for rash.  Allergic/Immunologic: Negative for environmental allergies.  Neurological: Negative for dizziness, numbness and headaches.  Psychiatric/Behavioral:       Denies concerns for anxiety or depression       Past Medical History:  Diagnosis Date  . History of chicken pox   . Inguinal hernia        Social History   Social History  . Marital status: Single    Spouse name: N/A  . Number of children: 2  . Years of education: N/A   Occupational History  . pressman    Social History Main Topics  . Smoking status: Former Smoker    Packs/day: 1.00    Years: 20.00    Types: Cigarettes    Quit date: 11/23/2010  . Smokeless tobacco: Never Used  . Alcohol use 0.0 oz/week     Comment: rarely  . Drug use: No  . Sexual activity: Not on file   Other Topics Concern  . Not on file   Social History Narrative   Single.   2 children.    Works in Academic librarian.   Enjoys playing softball, bowling, golfing.     Past Surgical History:  Procedure Laterality Date  . COLONOSCOPY  2011   [place]   . VIDEO BRONCHOSCOPY Bilateral 03/18/2012   Procedure: VIDEO BRONCHOSCOPY WITH FLUORO;  Surgeon: Tanda Rockers, MD;  Location: [facility] ENDOSCOPY;  Service: Cardiopulmonary;  Laterality: Bilateral;    Family History  Problem Relation Age of Onset  . Diabetes Mother   . Hypertension Mother   . Diabetes Father   . Emphysema Father   . Cancer Father     bladder  . Hypertension Father     No Known Allergies  No current outpatient prescriptions on file prior to visit.   No current facility-administered medications on file prior to visit.     BP (!) 172/98   Pulse 60  Temp 97.9 F (36.6 C) (Oral)   Ht 5\' 11"  (1.803 m)   Wt 232 lb 12.8 oz (105.6 kg)   SpO2 98%   BMI 32.47 kg/m    Objective:   Physical Exam  Constitutional: He is oriented to person, place, and time. He appears well-nourished.  HENT:  Right Ear: Tympanic membrane and ear canal normal.  Left Ear: Tympanic membrane and ear canal normal.  Nose: Nose normal. Right sinus exhibits no maxillary sinus tenderness and no frontal sinus tenderness. Left sinus exhibits no maxillary sinus tenderness and no frontal sinus tenderness.  Mouth/Throat: Oropharynx is clear and moist.  Eyes: Conjunctivae and EOM are normal. Pupils are  equal, round, and reactive to light.  Neck: Neck supple. Carotid bruit is not present. No thyromegaly present.  Cardiovascular: Normal rate, regular rhythm and normal heart sounds.   Pulmonary/Chest: Effort normal and breath sounds normal. He has no wheezes. He has no rales.  Abdominal: Soft. Bowel sounds are normal. There is no tenderness.  Genitourinary:  Genitourinary Comments: Inguinal hernia, right  Musculoskeletal: Normal range of motion.  Neurological: He is alert and oriented to person, place, and time. He has normal reflexes. No cranial nerve deficit.  Skin: Skin is warm and dry.  Stable lipoma to right lateral calf, non tender, no erythema.  Psychiatric: He has a normal mood and affect.          Assessment & Plan:

## 2015-09-15 NOTE — Assessment & Plan Note (Signed)
Level of 265 on recent labs. Poor diet, fair exercise. Start Fish Oil 1000 mg once daily with meals. Stressed importance of improvement in diet. Handout provided regarding changes in diet. Will repeat in 3 months.

## 2015-09-15 NOTE — Assessment & Plan Note (Signed)
Pending surgery in October.

## 2015-09-20 ENCOUNTER — Encounter
Admission: RE | Disposition: A | Discharge status: Home / Self Care | Payer: Self-pay | Source: Ambulatory Visit | Attending: General Surgery

## 2015-09-20 ENCOUNTER — Ambulatory Visit: Payer: BLUE CROSS/BLUE SHIELD | Admitting: Anesthesiology

## 2015-09-20 ENCOUNTER — Ambulatory Visit
Admission: RE | Admit: 2015-09-20 | Discharge: 2015-09-20 | Disposition: A | Discharge status: Home / Self Care | Payer: BLUE CROSS/BLUE SHIELD | Source: Ambulatory Visit | Attending: General Surgery | Admitting: General Surgery

## 2015-09-20 ENCOUNTER — Encounter: Payer: Self-pay | Admitting: *Deleted

## 2015-09-20 DIAGNOSIS — Z86010 Personal history of colonic polyps: Secondary | ICD-10-CM | POA: Diagnosis not present

## 2015-09-20 DIAGNOSIS — K5730 Diverticulosis of large intestine without perforation or abscess without bleeding: Secondary | ICD-10-CM | POA: Diagnosis not present

## 2015-09-20 DIAGNOSIS — Z87891 Personal history of nicotine dependence: Secondary | ICD-10-CM | POA: Diagnosis not present

## 2015-09-20 DIAGNOSIS — I10 Essential (primary) hypertension: Secondary | ICD-10-CM | POA: Diagnosis not present

## 2015-09-20 DIAGNOSIS — D120 Benign neoplasm of cecum: Secondary | ICD-10-CM | POA: Insufficient documentation

## 2015-09-20 DIAGNOSIS — Z1211 Encounter for screening for malignant neoplasm of colon: Secondary | ICD-10-CM | POA: Diagnosis present

## 2015-09-20 DIAGNOSIS — D123 Benign neoplasm of transverse colon: Secondary | ICD-10-CM | POA: Insufficient documentation

## 2015-09-20 DIAGNOSIS — K4090 Unilateral inguinal hernia, without obstruction or gangrene, not specified as recurrent: Secondary | ICD-10-CM

## 2015-09-20 HISTORY — PX: COLONOSCOPY WITH PROPOFOL: SHX5780

## 2015-09-20 HISTORY — DX: Essential (primary) hypertension: I10

## 2015-09-20 SURGERY — COLONOSCOPY WITH PROPOFOL
Anesthesia: General

## 2015-09-20 MED ORDER — PROPOFOL 10 MG/ML IV BOLUS
INTRAVENOUS | Status: DC | PRN
  Administered 2015-09-20: 90 mg via INTRAVENOUS

## 2015-09-20 MED ORDER — SODIUM CHLORIDE 0.9 % IV SOLN: INTRAVENOUS | Status: DC

## 2015-09-20 MED ORDER — MIDAZOLAM HCL 2 MG/2ML IJ SOLN
INTRAMUSCULAR | Status: DC | PRN
Start: 2015-09-20 — End: 2015-09-20
  Administered 2015-09-20: 2 mg via INTRAVENOUS

## 2015-09-20 MED ORDER — LIDOCAINE HCL (CARDIAC) 20 MG/ML IV SOLN
INTRAVENOUS | Status: DC | PRN
  Administered 2015-09-20: 100 mg via INTRATRACHEAL

## 2015-09-20 MED ORDER — SODIUM CHLORIDE 0.9 % IV SOLN
INTRAVENOUS | Status: DC
  Administered 2015-09-20: 13:00:00 via INTRAVENOUS

## 2015-09-20 MED ORDER — CEFAZOLIN SODIUM-DEXTROSE 2-4 GM/100ML-% IV SOLN
2.0000 g | INTRAVENOUS | Status: DC
  Filled 2015-09-20: qty 100

## 2015-09-20 MED ORDER — PROPOFOL 500 MG/50ML IV EMUL
INTRAVENOUS | Status: DC | PRN
  Administered 2015-09-20: 150 ug/kg/min via INTRAVENOUS

## 2015-09-20 NOTE — H&P (Signed)
No change in clinical condition. Tolerated prep. For colonoscopy.

## 2015-09-20 NOTE — Transfer of Care (Signed)
Immediate Anesthesia Transfer of Care Note  Patient: Curtis Stewart  Procedure(s) Performed: Procedure(s): COLONOSCOPY WITH PROPOFOL (N/A)  Patient Location: PACU  Anesthesia Type:General  Level of Consciousness: sedated  Airway & Oxygen Therapy: Patient Spontanous Breathing and Patient connected to nasal cannula oxygen  Post-op Assessment: Report given to RN and Post -op Vital signs reviewed and stable  Post vital signs: Reviewed and stable  Last Vitals:  Vitals:   09/20/15 1237 09/20/15 1358  BP: (!) 172/88 (!) 171/50  Pulse: 61 69  Resp: 17 (!) 27  Temp: (!) 35.7 C     Last Pain:  Vitals:   09/20/15 1237  TempSrc: Tympanic         Complications: No apparent anesthesia complications

## 2015-09-20 NOTE — Anesthesia Postprocedure Evaluation (Signed)
Anesthesia Post Note  Patient: Curtis Stewart  Procedure(s) Performed: Procedure(s) (LRB): COLONOSCOPY WITH PROPOFOL (N/A)  Patient location during evaluation: PACU Anesthesia Type: General Level of consciousness: awake and alert and oriented Pain management: pain level controlled Vital Signs Assessment: post-procedure vital signs reviewed and stable Respiratory status: spontaneous breathing Cardiovascular status: blood pressure returned to baseline Anesthetic complications: no    Last Vitals:  Vitals:   09/20/15 1418 09/20/15 1428  BP: 120/74 116/65  Pulse: 67 (!) 55  Resp: 16 14  Temp:      Last Pain:  Vitals:   09/20/15 1357  TempSrc: Tympanic                 Emanie Behan

## 2015-09-20 NOTE — Op Note (Signed)
[facility] Gastroenterology Patient Name: Curtis Stewart Procedure Date: 09/20/2015 1:16 PM MRN: [id number] Account #: [id number] Date of Birth: October 09, 1955 Admit Type: Outpatient Age: 60 Room: [facility] ENDO ROOM 4 Gender: Male Note Status: Finalized Procedure:            Colonoscopy Indications:          High risk colon cancer surveillance: Personal history                        of colonic polyps Providers:            Robert Bellow, MD Referring MD:         Pleas Koch (Referring MD) Medicines:            Monitored Anesthesia Care Complications:        No immediate complications. Procedure:            Pre-Anesthesia Assessment:                       - Prior to the procedure, a History and Physical was                        performed, and patient medications, allergies and                        sensitivities were reviewed. The patient's tolerance of                        previous anesthesia was reviewed.                       - The risks and benefits of the procedure and the                        sedation options and risks were discussed with the                        patient. All questions were answered and informed                        consent was obtained.                       After obtaining informed consent, the colonoscope was                        passed under direct vision. Throughout the procedure,                        the patient's blood pressure, pulse, and oxygen                        saturations were monitored continuously. The                        Colonoscope was introduced through the anus and                        advanced to the the terminal ileum. The colonoscopy was  performed without difficulty. The patient tolerated the                        procedure well. The quality of the bowel preparation                        was excellent. Findings:      Many medium-mouthed diverticula were found in the  recto-sigmoid colon,       transverse colon and ascending colon.      A 9 mm polyp was found in the distal transverse colon. The polyp was       sessile. The polyp was removed with a hot snare. Resection and retrieval       were complete.      A 40 mm polyp was found in the ileocecal valve. Partial polypectomy was       completed with hot snare for pathologic sampling. The polyp involved the       posterior aspect of the ileoceal valve and was not accessable for       complete polypectomy.      The retroflexed view of the distal rectum and anal verge was normal and       showed no anal or rectal abnormalities. Impression:           - Diverticulosis in the recto-sigmoid colon, in the                        transverse colon and in the ascending colon.                       - One 9 mm polyp in the distal transverse colon,                        removed with a hot snare. Resected and retrieved.                       - One 40 mm polyp at the ileocecal valve. Biopsied.                       - The distal rectum and anal verge are normal on                        retroflexion view. Recommendation:       - Return to endoscopist in 1 week. Procedure Code(s):    --- Professional ---                       [phone], Colonoscopy, flexible; with removal of tumor(s),                        polyp(s), or other lesion(s) by snare technique                       [id number], 63, Colonoscopy, flexible; with biopsy, single                        or multiple Diagnosis Code(s):    --- Professional ---                       Z86.010, Personal history of colonic polyps  D12.3, Benign neoplasm of transverse colon (hepatic                        flexure or splenic flexure)                       D12.0, Benign neoplasm of cecum                       K57.30, Diverticulosis of large intestine without                        perforation or abscess without bleeding CPT copyright 2016 American Medical  Association. All rights reserved. The codes documented in this report are preliminary and upon coder review may  be revised to meet current compliance requirements. Robert Bellow, MD 09/20/2015 2:00:00 PM This report has been signed electronically. Number of Addenda: 0 Note Initiated On: 09/20/2015 1:16 PM Scope Withdrawal Time: 0 hours 21 minutes 45 seconds  Total Procedure Duration: 0 hours 30 minutes 42 seconds       [facility]

## 2015-09-20 NOTE — Anesthesia Preprocedure Evaluation (Signed)
Anesthesia Evaluation  Patient identified by MRN, date of birth, ID band Patient awake    Reviewed: Allergy & Precautions, H&P , NPO status , Patient's Chart, lab work & pertinent test results, reviewed documented beta blocker date and time   Airway Mallampati: II   Neck ROM: full    Dental  (+) Poor Dentition   Pulmonary neg pulmonary ROS, former smoker,    Pulmonary exam normal        Cardiovascular hypertension, On Medications negative cardio ROS Normal cardiovascular exam     Neuro/Psych negative neurological ROS  negative psych ROS   GI/Hepatic negative GI ROS, Neg liver ROS,   Endo/Other  negative endocrine ROS  Renal/GU negative Renal ROS  negative genitourinary   Musculoskeletal   Abdominal   Peds  Hematology negative hematology ROS (+)   Anesthesia Other Findings Past Medical History: No date: History of chicken pox No date: Inguinal hernia Past Surgical History: 2011: COLONOSCOPY     Comment: [place]  03/18/2012: VIDEO BRONCHOSCOPY Bilateral     Comment: Procedure: VIDEO BRONCHOSCOPY WITH FLUORO;                Surgeon: Tanda Rockers, MD;  Location: [facility]               ENDOSCOPY;  Service: Cardiopulmonary;                Laterality: Bilateral;   Reproductive/Obstetrics                             Anesthesia Physical Anesthesia Plan  ASA: II  Anesthesia Plan: General   Post-op Pain Management:    Induction:   Airway Management Planned:   Additional Equipment:   Intra-op Plan:   Post-operative Plan:   Informed Consent: I have reviewed the patients History and Physical, chart, labs and discussed the procedure including the risks, benefits and alternatives for the proposed anesthesia with the patient or authorized representative who has indicated his/her understanding and acceptance.   Dental Advisory Given  Plan Discussed with: CRNA  Anesthesia Plan  Comments:         Anesthesia Quick Evaluation

## 2015-09-20 NOTE — Anesthesia Procedure Notes (Signed)
Performed by: Lance Muss Pre-anesthesia Checklist: Patient identified, Emergency Drugs available, Suction available, Timeout performed and Patient being monitored Patient Re-evaluated:Patient Re-evaluated prior to inductionOxygen Delivery Method: Nasal cannula Intubation Type: IV induction

## 2015-09-21 ENCOUNTER — Encounter: Payer: Self-pay | Admitting: General Surgery

## 2015-09-22 LAB — SURGICAL PATHOLOGY

## 2015-09-23 ENCOUNTER — Telehealth: Payer: Self-pay

## 2015-09-23 NOTE — Telephone Encounter (Signed)
Message left for patient to call back

## 2015-09-23 NOTE — Telephone Encounter (Signed)
PT RETURNING CARYL-LYN'S CALL.HE WILL TRY CALLING BACK ABOUT 12:45 TODAY.

## 2015-09-23 NOTE — Telephone Encounter (Signed)
-----   Message from Robert Bellow, MD sent at 09/23/2015  8:09 AM EDT ----- Please notify biopsies show no cancer. Need September 20 appt moved up to discuss biopsy results.  ----- Message ----- From: Interface, Lab In Three Zero One Sent: 09/22/2015   6:25 PM To: Robert Bellow, MD

## 2015-09-23 NOTE — Telephone Encounter (Signed)
Notified patient as instructed, patient pleased. Discussed follow-up appointments, patient agrees. Follow up here on 09/29/15 at 2:00 pm.

## 2015-09-29 ENCOUNTER — Encounter: Payer: Self-pay | Admitting: General Surgery

## 2015-09-29 ENCOUNTER — Ambulatory Visit (INDEPENDENT_AMBULATORY_CARE_PROVIDER_SITE_OTHER): Payer: BLUE CROSS/BLUE SHIELD | Admitting: General Surgery

## 2015-09-29 VITALS — BP 158/84 | HR 52 | Resp 12 | Ht 70.0 in | Wt 232.0 lb

## 2015-09-29 DIAGNOSIS — K635 Polyp of colon: Secondary | ICD-10-CM | POA: Diagnosis not present

## 2015-09-29 DIAGNOSIS — K4090 Unilateral inguinal hernia, without obstruction or gangrene, not specified as recurrent: Secondary | ICD-10-CM | POA: Diagnosis not present

## 2015-09-29 DIAGNOSIS — D1723 Benign lipomatous neoplasm of skin and subcutaneous tissue of right leg: Secondary | ICD-10-CM | POA: Diagnosis not present

## 2015-09-29 DIAGNOSIS — Z86010 Personal history of colonic polyps: Secondary | ICD-10-CM

## 2015-09-29 NOTE — Patient Instructions (Signed)

## 2015-09-29 NOTE — Progress Notes (Signed)
Patient ID: Curtis Stewart, male   DOB: March 21, 1955, 60 y.o.   MRN: [id number]  Chief Complaint  Patient presents with  . Pre-op Exam    inguinal hernia    HPI Curtis Stewart is a 60 y.o. male here today for his pre op inguinal hernia repair and excision right lower leg lipoma scheduled on 10/27/15.   Colonoscopy was done on 09/20/15. Discuss results.  HPI  Past Medical History:  Diagnosis Date  . History of chicken pox   . Hypertension   . Inguinal hernia     Past Surgical History:  Procedure Laterality Date  . COLONOSCOPY  2011   [place]   . COLONOSCOPY WITH PROPOFOL N/A 09/20/2015   Procedure: COLONOSCOPY WITH PROPOFOL;  Surgeon: Robert Bellow, MD;  Location: [facility] ENDOSCOPY;  Service: Endoscopy;  Laterality: N/A;  . VIDEO BRONCHOSCOPY Bilateral 03/18/2012   Procedure: VIDEO BRONCHOSCOPY WITH FLUORO;  Surgeon: Tanda Rockers, MD;  Location: [facility] ENDOSCOPY;  Service: Cardiopulmonary;  Laterality: Bilateral;    Family History  Problem Relation Age of Onset  . Diabetes Mother   . Hypertension Mother   . Diabetes Father   . Emphysema Father   . Cancer Father     bladder  . Hypertension Father     Social History Social History  Substance Use Topics  . Smoking status: Former Smoker    Packs/day: 1.00    Years: 20.00    Types: Cigarettes    Quit date: 11/23/2010  . Smokeless tobacco: Never Used  . Alcohol use 0.0 oz/week     Comment: rarely    No Known Allergies  Current Outpatient Prescriptions  Medication Sig Dispense Refill  . lisinopril (PRINIVIL,ZESTRIL) 20 MG tablet Take 1 tablet (20 mg total) by mouth daily. 30 tablet 1   No current facility-administered medications for this visit.     Review of Systems Review of Systems  Constitutional: Negative.   Respiratory: Negative.   Cardiovascular: Negative.     Blood pressure (!) 158/84, pulse (!) 52, resp. rate 12, height 5\' 10"  (1.778 m), weight 232 lb (105.2 kg).  Physical Exam Physical Exam   Constitutional: He is oriented to person, place, and time. He appears well-developed and well-nourished.  HENT:  Mouth/Throat: Oropharynx is clear and moist.  Eyes: Conjunctivae are normal. No scleral icterus.  Neck: Neck supple.  Cardiovascular: Normal rate, regular rhythm and normal heart sounds.   Pulmonary/Chest: Effort normal and breath sounds normal.  Abdominal: Soft. Normal appearance and bowel sounds are normal. There is no tenderness. A hernia is present. Hernia confirmed positive in the right inguinal area.  Lymphadenopathy:    He has no cervical adenopathy.  Neurological: He is alert and oriented to person, place, and time.  Skin: Skin is warm and dry.  Psychiatric: His behavior is normal.    Data Reviewed Colonoscopy of 09/20/2015 reviewed.  Pathology: DIAGNOSIS:  A. ILEOCECAL VALVE POLYP; HOT SNARE:  - TUBULOVILLOUS ADENOMA, MULTIPLE FRAGMENTS.  - NEGATIVE FOR HIGH-GRADE DYSPLASIA AND MALIGNANCY.   Assessment    Colon polyp not amenable to endoscopic polypectomy.  Symptomatic right inguinal hernia.  Large right lower extremity lipoma.      Plan    The polyp showed no evidence of malignancy or high-grade dysplasia, with an endoscopic snare biopsy sample. This will warrant formal C Keck to me with resection of the ileocecal valve for removal. I am reluctant to do a colon resection and time of a hernia repair where prosthetic mesh is  used and have recommended that we proceed with plans for his hernia repair and then returned before the end of the calendar year for right colon removal. This is amenable to the patient.    Hernia precautions and incarceration were discussed with the patient. If they develop symptoms of an incarcerated hernia, they were encouraged to seek prompt medical attention.  I have recommended repair of the hernia using mesh on an outpatient basis in the near future. The risk of infection was reviewed. The role of prosthetic mesh to minimize the  risk of recurrence was reviewed.  Plan to be out of work 10-14 days.  This information has been scribed by Karie Fetch RN, BSN,BC.   Robert Bellow 10/01/2015, 1:35 PM

## 2015-10-01 DIAGNOSIS — Z86010 Personal history of colonic polyps: Secondary | ICD-10-CM | POA: Insufficient documentation

## 2015-10-12 ENCOUNTER — Ambulatory Visit: Payer: BLUE CROSS/BLUE SHIELD | Admitting: General Surgery

## 2015-10-13 ENCOUNTER — Encounter: Payer: Self-pay | Admitting: Primary Care

## 2015-10-13 ENCOUNTER — Ambulatory Visit (INDEPENDENT_AMBULATORY_CARE_PROVIDER_SITE_OTHER): Payer: BLUE CROSS/BLUE SHIELD | Admitting: Primary Care

## 2015-10-13 VITALS — BP 146/98 | HR 70 | Temp 97.9°F | Ht 70.0 in | Wt 238.4 lb

## 2015-10-13 DIAGNOSIS — I10 Essential (primary) hypertension: Secondary | ICD-10-CM | POA: Diagnosis not present

## 2015-10-13 LAB — BASIC METABOLIC PANEL
BUN: 14 mg/dL (ref 6–23)
CALCIUM: 8.7 mg/dL (ref 8.4–10.5)
CO2: 33 meq/L — AB (ref 19–32)
Chloride: 104 mEq/L (ref 96–112)
Creatinine, Ser: 1.18 mg/dL (ref 0.40–1.50)
GFR: 67.04 mL/min (ref >60.00)
GLUCOSE: 121 mg/dL — AB (ref 70–99)
POTASSIUM: 4.1 meq/L (ref 3.5–5.1)
SODIUM: 140 meq/L (ref 135–145)

## 2015-10-13 MED ORDER — LISINOPRIL-HYDROCHLOROTHIAZIDE 20-25 MG PO TABS: 1.0000 | ORAL_TABLET | Freq: Every day | ORAL | 1 refills | Status: DC

## 2015-10-13 NOTE — Progress Notes (Signed)
Pre visit review using our clinic review tool, if applicable. No additional management support is needed unless otherwise documented below in the visit note. 

## 2015-10-13 NOTE — Patient Instructions (Signed)
Start lisinopril-hydrochlorothiazide 20/25 mg tablets for high blood pressure. Take 1 tablet by mouth once daily.  Do not take any more of the lisinopril 20 mg tablets.  Check your BP once daily for the next 2 weeks, I will call you for your readings at that time.  Continue to work on limiting salt in your diet as discussed.  Complete lab work prior to leaving today. I will notify you of your results once received.   It was a pleasure to see you today!  DASH Eating Plan DASH stands for "Dietary Approaches to Stop Hypertension." The DASH eating plan is a healthy eating plan that has been shown to reduce high blood pressure (hypertension). Additional health benefits may include reducing the risk of type 2 diabetes mellitus, heart disease, and stroke. The DASH eating plan may also help with weight loss. WHAT DO I NEED TO KNOW ABOUT THE DASH EATING PLAN? For the DASH eating plan, you will follow these general guidelines:  Choose foods with a percent daily value for sodium of less than 5% (as listed on the food label).  Use salt-free seasonings or herbs instead of table salt or sea salt.  Check with your health care provider or pharmacist before using salt substitutes.  Eat lower-sodium products, often labeled as "lower sodium" or "no salt added."  Eat fresh foods.  Eat more vegetables, fruits, and low-fat dairy products.  Choose whole grains. Look for the word "whole" as the first word in the ingredient list.  Choose fish and skinless chicken or [place] more often than red meat. Limit fish, poultry, and meat to 6 oz (170 g) each day.  Limit sweets, desserts, sugars, and sugary drinks.  Choose heart-healthy fats.  Limit cheese to 1 oz (28 g) per day.  Eat more home-cooked food and less restaurant, buffet, and fast food.  Limit fried foods.  Cook foods using methods other than frying.  Limit canned vegetables. If you do use them, rinse them well to decrease the sodium.  When  eating at a restaurant, ask that your food be prepared with less salt, or no salt if possible. WHAT FOODS CAN I EAT? Seek help from a dietitian for individual calorie needs. Grains Whole grain or whole wheat bread. Brown rice. Whole grain or whole wheat pasta. Quinoa, bulgur, and whole grain cereals. Low-sodium cereals. Corn or whole wheat flour tortillas. Whole grain cornbread. Whole grain crackers. Low-sodium crackers. Vegetables Fresh or frozen vegetables (raw, steamed, roasted, or grilled). Low-sodium or reduced-sodium tomato and vegetable juices. Low-sodium or reduced-sodium tomato sauce and paste. Low-sodium or reduced-sodium canned vegetables.  Fruits All fresh, canned (in natural juice), or frozen fruits. Meat and Other Protein Products Ground beef (85% or leaner), grass-fed beef, or beef trimmed of fat. Skinless chicken or [place]. Ground chicken or [place]. Pork trimmed of fat. All fish and seafood. Eggs. Dried beans, peas, or lentils. Unsalted nuts and seeds. Unsalted canned beans. Dairy Low-fat dairy products, such as skim or 1% milk, 2% or reduced-fat cheeses, low-fat ricotta or cottage cheese, or plain low-fat yogurt. Low-sodium or reduced-sodium cheeses. Fats and Oils Tub margarines without trans fats. Light or reduced-fat mayonnaise and salad dressings (reduced sodium). Avocado. Safflower, olive, or canola oils. Natural peanut or almond butter. Other Unsalted popcorn and pretzels. The items listed above may not be a complete list of recommended foods or beverages. Contact your dietitian for more options. WHAT FOODS ARE NOT RECOMMENDED? Grains White bread. White pasta. White rice. Refined cornbread. Bagels and croissants.  Crackers that contain trans fat. Vegetables Creamed or fried vegetables. Vegetables in a cheese sauce. Regular canned vegetables. Regular canned tomato sauce and paste. Regular tomato and vegetable juices. Fruits Dried fruits. Canned fruit in light or heavy  syrup. Fruit juice. Meat and Other Protein Products Fatty cuts of meat. Ribs, chicken wings, bacon, sausage, bologna, salami, chitterlings, fatback, hot dogs, bratwurst, and packaged luncheon meats. Salted nuts and seeds. Canned beans with salt. Dairy Whole or 2% milk, cream, half-and-half, and cream cheese. Whole-fat or sweetened yogurt. Full-fat cheeses or blue cheese. Nondairy creamers and whipped toppings. Processed cheese, cheese spreads, or cheese curds. Condiments Onion and garlic salt, seasoned salt, table salt, and sea salt. Canned and packaged gravies. Worcestershire sauce. Tartar sauce. Barbecue sauce. Teriyaki sauce. Soy sauce, including reduced sodium. Steak sauce. Fish sauce. Oyster sauce. Cocktail sauce. Horseradish. Ketchup and mustard. Meat flavorings and tenderizers. Bouillon cubes. Hot sauce. Tabasco sauce. Marinades. Taco seasonings. Relishes. Fats and Oils Butter, stick margarine, lard, shortening, ghee, and bacon fat. Coconut, palm kernel, or palm oils. Regular salad dressings. Other Pickles and olives. Salted popcorn and pretzels. The items listed above may not be a complete list of foods and beverages to avoid. Contact your dietitian for more information. WHERE CAN I FIND MORE INFORMATION? National Heart, Lung, and Blood Institute: travelstabloid.com   This information is not intended to replace advice given to you by your health care provider. Make sure you discuss any questions you have with your health care provider.   Document Released: 12/28/2010 Document Revised: 01/29/2014 Document Reviewed: 11/12/2012 Elsevier Interactive Patient Education [employer].

## 2015-10-13 NOTE — Progress Notes (Signed)
   Subjective:    Patient ID: Curtis Stewart, male    DOB: 07-02-1955, 60 y.o.   MRN: [id number]  HPI  Curtis Stewart is a 60 year old male who presents today for follow up of hypertension. He was evaluated several weeks ago for his physical with evidence of several office elevated BP readings. He was initiated on Lisinopril 20 mg during that visit.  Since his last visit he's been checking his BP at home and is getting readings of 130's-140's/70's-90's. His BP is above goal in the clinic today at 146/98. He denies chest pain, visual changes, lower extremity edema.  Review of Systems  Eyes: Negative for visual disturbance.  Respiratory: Negative for shortness of breath.   Cardiovascular: Negative for chest pain.  Neurological: Negative for dizziness and headaches.       Past Medical History:  Diagnosis Date  . History of chicken pox   . Hypertension   . Inguinal hernia      Social History   Social History  . Marital status: Single    Spouse name: N/A  . Number of children: 2  . Years of education: N/A   Occupational History  . pressman    Social History Main Topics  . Smoking status: Former Smoker    Packs/day: 1.00    Years: 20.00    Types: Cigarettes    Quit date: 11/23/2010  . Smokeless tobacco: Never Used  . Alcohol use 0.0 oz/week     Comment: rarely  . Drug use: No  . Sexual activity: Not on file   Other Topics Concern  . Not on file   Social History Narrative   Single.   2 children.    Works in Academic librarian.   Enjoys playing softball, bowling, golfing.     Past Surgical History:  Procedure Laterality Date  . COLONOSCOPY  2011   [place]   . COLONOSCOPY WITH PROPOFOL N/A 09/20/2015   Procedure: COLONOSCOPY WITH PROPOFOL;  Surgeon: Robert Bellow, MD;  Location: [facility] ENDOSCOPY;  Service: Endoscopy;  Laterality: N/A;  . VIDEO BRONCHOSCOPY Bilateral 03/18/2012   Procedure: VIDEO BRONCHOSCOPY WITH FLUORO;  Surgeon: Tanda Rockers, MD;  Location: [facility] ENDOSCOPY;   Service: Cardiopulmonary;  Laterality: Bilateral;    Family History  Problem Relation Age of Onset  . Diabetes Mother   . Hypertension Mother   . Diabetes Father   . Emphysema Father   . Cancer Father     bladder  . Hypertension Father     No Known Allergies  No current outpatient prescriptions on file prior to visit.   No current facility-administered medications on file prior to visit.     BP (!) 146/98   Pulse 70   Temp 97.9 F (36.6 C) (Oral)   Ht 5\' 10"  (1.778 m)   Wt 238 lb 6.4 oz (108.1 kg)   SpO2 96%   BMI 34.21 kg/m    Objective:   Physical Exam  Constitutional: He appears well-nourished.  Cardiovascular: Normal rate and regular rhythm.   Pulmonary/Chest: Effort normal and breath sounds normal.  Skin: Skin is warm and dry.          Assessment & Plan:

## 2015-10-13 NOTE — Assessment & Plan Note (Signed)
Home readings and office ready today above goal. Will add HCTZ 25 mg to regimen as a combination tablet. Check BMP today. He will call with BP readings in 2 weeks.

## 2015-10-19 ENCOUNTER — Encounter
Admission: RE | Admit: 2015-10-19 | Discharge: 2015-10-19 | Disposition: A | Discharge status: Home / Self Care | Payer: BLUE CROSS/BLUE SHIELD | Source: Ambulatory Visit | Attending: General Surgery | Admitting: General Surgery

## 2015-10-19 DIAGNOSIS — I10 Essential (primary) hypertension: Secondary | ICD-10-CM | POA: Diagnosis not present

## 2015-10-19 NOTE — Patient Instructions (Signed)
Your procedure is scheduled on: 10/27/15 Report to Day Surgery. 2ND FLOOR MEDICAL MALL ENTRANCE To find out your arrival time please call [phone] between 1PM - 3PM on 10/26/15.  Remember: Instructions that are not followed completely may result in serious medical risk, up to and including death, or upon the discretion of your surgeon and anesthesiologist your surgery may need to be rescheduled.    __X__ 1. Do not eat food or drink liquids after midnight. No gum chewing or hard candies.     __X__ 2. No Alcohol for 24 hours before or after surgery.   ____ 3. Bring all medications with you on the day of surgery if instructed.    __X__ 4. Notify your doctor if there is any change in your medical condition     (cold, fever, infections).     Do not wear jewelry, make-up, hairpins, clips or nail polish.  Do not wear lotions, powders, or perfumes.   Do not shave 48 hours prior to surgery. Men may shave face and neck.  Do not bring valuables to the hospital.    [facility] is not responsible for any belongings or valuables.               Contacts, dentures or bridgework may not be worn into surgery.  Leave your suitcase in the car. After surgery it may be brought to your room.  For patients admitted to the hospital, discharge time is determined by your                treatment team.   Patients discharged the day of surgery will not be allowed to drive home.   Please read over the following fact sheets that you were given:      ____ Take these medicines the morning of surgery with A SIP OF WATER:    1. NONE  2.   3.   4.  5.  6.  ____ Fleet Enema (as directed)   __X__ Use CHG Soap as directed  ____ Use inhalers on the day of surgery  ____ Stop metformin 2 days prior to surgery    ____ Take 1/2 of usual insulin dose the night before surgery and none on the morning of surgery.   ____ Stop Coumadin/Plavix/aspirin on   ____ Stop Anti-inflammatories on    ____ Stop  supplements until after surgery.    ____ Bring C-Pap to the hospital.

## 2015-10-21 ENCOUNTER — Encounter
Admission: RE | Admit: 2015-10-21 | Discharge: 2015-10-21 | Disposition: A | Discharge status: Home / Self Care | Payer: BLUE CROSS/BLUE SHIELD | Source: Ambulatory Visit | Attending: General Surgery | Admitting: General Surgery

## 2015-10-21 DIAGNOSIS — I10 Essential (primary) hypertension: Secondary | ICD-10-CM | POA: Insufficient documentation

## 2015-10-25 ENCOUNTER — Telehealth: Payer: Self-pay | Admitting: *Deleted

## 2015-10-25 NOTE — Telephone Encounter (Signed)
Return to work tentative 11-17-15

## 2015-10-25 NOTE — Telephone Encounter (Signed)
FMLA papers and return to work dates?

## 2015-10-27 ENCOUNTER — Ambulatory Visit: Payer: BLUE CROSS/BLUE SHIELD | Admitting: Anesthesiology

## 2015-10-27 ENCOUNTER — Encounter
Admission: RE | Disposition: A | Discharge status: Home / Self Care | Payer: Self-pay | Source: Ambulatory Visit | Attending: General Surgery

## 2015-10-27 ENCOUNTER — Ambulatory Visit
Admission: RE | Admit: 2015-10-27 | Discharge: 2015-10-27 | Disposition: A | Discharge status: Home / Self Care | Payer: BLUE CROSS/BLUE SHIELD | Source: Ambulatory Visit | Attending: General Surgery | Admitting: General Surgery

## 2015-10-27 ENCOUNTER — Encounter: Payer: Self-pay | Admitting: *Deleted

## 2015-10-27 ENCOUNTER — Telehealth: Payer: Self-pay | Admitting: Primary Care

## 2015-10-27 DIAGNOSIS — Z87891 Personal history of nicotine dependence: Secondary | ICD-10-CM | POA: Insufficient documentation

## 2015-10-27 DIAGNOSIS — D1723 Benign lipomatous neoplasm of skin and subcutaneous tissue of right leg: Secondary | ICD-10-CM | POA: Diagnosis not present

## 2015-10-27 DIAGNOSIS — I10 Essential (primary) hypertension: Secondary | ICD-10-CM | POA: Diagnosis not present

## 2015-10-27 DIAGNOSIS — K4090 Unilateral inguinal hernia, without obstruction or gangrene, not specified as recurrent: Secondary | ICD-10-CM | POA: Diagnosis present

## 2015-10-27 HISTORY — PX: INGUINAL HERNIA REPAIR: SHX194

## 2015-10-27 HISTORY — PX: LIPOMA EXCISION: SHX5283

## 2015-10-27 SURGERY — REPAIR, HERNIA, INGUINAL, ADULT
Anesthesia: General | Site: Leg Lower | Laterality: Right | Wound class: Clean

## 2015-10-27 MED ORDER — BUPIVACAINE-EPINEPHRINE (PF) 0.5% -1:200000 IJ SOLN
INTRAMUSCULAR | Status: AC
  Filled 2015-10-27: qty 30

## 2015-10-27 MED ORDER — FENTANYL CITRATE (PF) 100 MCG/2ML IJ SOLN
INTRAMUSCULAR | Status: DC | PRN
  Administered 2015-10-27: 50 ug via INTRAVENOUS

## 2015-10-27 MED ORDER — PROPOFOL 10 MG/ML IV BOLUS
INTRAVENOUS | Status: DC | PRN
  Administered 2015-10-27: 150 mg via INTRAVENOUS

## 2015-10-27 MED ORDER — KETOROLAC TROMETHAMINE 30 MG/ML IJ SOLN
INTRAMUSCULAR | Status: DC | PRN
  Administered 2015-10-27: 30 mg via INTRAVENOUS

## 2015-10-27 MED ORDER — FAMOTIDINE 20 MG PO TABS
ORAL_TABLET | ORAL | Status: AC
  Filled 2015-10-27: qty 1

## 2015-10-27 MED ORDER — FAMOTIDINE 20 MG PO TABS
20.0000 mg | ORAL_TABLET | Freq: Once | ORAL | Status: AC
  Administered 2015-10-27: 20 mg via ORAL

## 2015-10-27 MED ORDER — CEFAZOLIN SODIUM-DEXTROSE 2-4 GM/100ML-% IV SOLN
INTRAVENOUS | Status: AC
  Filled 2015-10-27: qty 100

## 2015-10-27 MED ORDER — LIDOCAINE HCL (CARDIAC) 20 MG/ML IV SOLN
INTRAVENOUS | Status: DC | PRN
  Administered 2015-10-27: 100 mg via INTRAVENOUS

## 2015-10-27 MED ORDER — BUPIVACAINE-EPINEPHRINE (PF) 0.5% -1:200000 IJ SOLN
INTRAMUSCULAR | Status: DC | PRN
  Administered 2015-10-27: 30 mL

## 2015-10-27 MED ORDER — HYDROMORPHONE HCL 1 MG/ML IJ SOLN
INTRAMUSCULAR | Status: DC | PRN
  Administered 2015-10-27: .5 mg via INTRAVENOUS

## 2015-10-27 MED ORDER — EPHEDRINE SULFATE 50 MG/ML IJ SOLN
INTRAMUSCULAR | Status: DC | PRN
  Administered 2015-10-27 (×2): 10 mg via INTRAVENOUS

## 2015-10-27 MED ORDER — PHENYLEPHRINE HCL 10 MG/ML IJ SOLN
INTRAMUSCULAR | Status: DC | PRN
  Administered 2015-10-27: 100 ug via INTRAVENOUS

## 2015-10-27 MED ORDER — LACTATED RINGERS IV SOLN
INTRAVENOUS | Status: DC
  Administered 2015-10-27 (×2): via INTRAVENOUS

## 2015-10-27 MED ORDER — ACETAMINOPHEN 10 MG/ML IV SOLN
INTRAVENOUS | Status: AC
  Filled 2015-10-27: qty 100

## 2015-10-27 MED ORDER — DEXAMETHASONE SODIUM PHOSPHATE 10 MG/ML IJ SOLN
INTRAMUSCULAR | Status: DC | PRN
  Administered 2015-10-27: 5 mg via INTRAVENOUS

## 2015-10-27 MED ORDER — ACETAMINOPHEN 10 MG/ML IV SOLN
INTRAVENOUS | Status: DC | PRN
  Administered 2015-10-27: 1000 mg via INTRAVENOUS

## 2015-10-27 MED ORDER — CEFAZOLIN SODIUM-DEXTROSE 2-4 GM/100ML-% IV SOLN
2.0000 g | INTRAVENOUS | Status: AC
  Administered 2015-10-27: 2 g via INTRAVENOUS

## 2015-10-27 MED ORDER — LIDOCAINE-EPINEPHRINE (PF) 1 %-1:200000 IJ SOLN
INTRAMUSCULAR | Status: AC
  Filled 2015-10-27: qty 30

## 2015-10-27 MED ORDER — MIDAZOLAM HCL 2 MG/2ML IJ SOLN
INTRAMUSCULAR | Status: DC | PRN
Start: 2015-10-27 — End: 2015-10-27
  Administered 2015-10-27: 2 mg via INTRAVENOUS

## 2015-10-27 MED ORDER — HYDROCODONE-ACETAMINOPHEN 5-325 MG PO TABS: 1.0000 | ORAL_TABLET | ORAL | 0 refills | Status: DC | PRN

## 2015-10-27 MED ORDER — LIDOCAINE-EPINEPHRINE (PF) 1 %-1:200000 IJ SOLN
INTRAMUSCULAR | Status: DC | PRN
  Administered 2015-10-27: 30 mL

## 2015-10-27 SURGICAL SUPPLY — 49 items
BAG COUNTER SPONGE EZ (MISCELLANEOUS) ×3 IMPLANT
BLADE SURG 15 STRL SS SAFETY (BLADE) ×4 IMPLANT
BNDG GAUZE 4.5X4.1 6PLY STRL (MISCELLANEOUS) ×4 IMPLANT
CANISTER SUCT 1200ML W/VALVE (MISCELLANEOUS) ×4 IMPLANT
CHLORAPREP W/TINT 26ML (MISCELLANEOUS) ×4 IMPLANT
CLOSURE WOUND 1/2 X4 (GAUZE/BANDAGES/DRESSINGS) ×1
COUNTER SPONGE BAG EZ (MISCELLANEOUS) ×1
DECANTER SPIKE VIAL GLASS SM (MISCELLANEOUS) ×4 IMPLANT
DRAIN PENROSE 1/4X12 LTX (DRAIN) ×4 IMPLANT
DRAPE LAPAROTOMY 100X77 ABD (DRAPES) ×4 IMPLANT
DRAPE LAPAROTOMY 77X122 PED (DRAPES) ×4 IMPLANT
DRESSING TELFA 4X3 1S ST N-ADH (GAUZE/BANDAGES/DRESSINGS) ×4 IMPLANT
DRSG TEGADERM 4X4.75 (GAUZE/BANDAGES/DRESSINGS) ×4 IMPLANT
DRSG TELFA 3X8 NADH (GAUZE/BANDAGES/DRESSINGS) ×4 IMPLANT
ELECT REM PT RETURN 9FT ADLT (ELECTROSURGICAL) ×4
ELECTRODE REM PT RTRN 9FT ADLT (ELECTROSURGICAL) ×2 IMPLANT
GAUZE FLUFF 18X24 1PLY STRL (GAUZE/BANDAGES/DRESSINGS) ×4 IMPLANT
GLOVE BIO SURGEON STRL SZ7.5 (GLOVE) ×12 IMPLANT
GLOVE INDICATOR 8.0 STRL GRN (GLOVE) ×4 IMPLANT
GOWN STRL REUS W/ TWL LRG LVL3 (GOWN DISPOSABLE) ×4 IMPLANT
GOWN STRL REUS W/TWL LRG LVL3 (GOWN DISPOSABLE) ×4
KIT RM TURNOVER STRD PROC AR (KITS) ×4 IMPLANT
LABEL OR SOLS (LABEL) ×4 IMPLANT
MESH HERNIA SYS ULTRAPRO LRG (Mesh General) ×4 IMPLANT
NDL SAFETY 22GX1.5 (NEEDLE) ×4 IMPLANT
NEEDLE HYPO 25X1 1.5 SAFETY (NEEDLE) ×4 IMPLANT
NS IRRIG 500ML POUR BTL (IV SOLUTION) ×4 IMPLANT
PACK BASIN MINOR ARMC (MISCELLANEOUS) ×4 IMPLANT
SOL PREP PVP 2OZ (MISCELLANEOUS) ×4
SOLUTION PREP PVP 2OZ (MISCELLANEOUS) ×2 IMPLANT
SPONGE XRAY 4X4 16PLY STRL (MISCELLANEOUS) ×4 IMPLANT
STRAP SAFETY BODY (MISCELLANEOUS) IMPLANT
STRIP CLOSURE SKIN 1/2X4 (GAUZE/BANDAGES/DRESSINGS) ×3 IMPLANT
SUT ETHILON 4-0 (SUTURE)
SUT ETHILON 4-0 FS2 18XMFL BLK (SUTURE)
SUT SURGILON 0 BLK (SUTURE) ×8 IMPLANT
SUT VIC AB 2-0 CT1 (SUTURE) ×4 IMPLANT
SUT VIC AB 2-0 SH 27 (SUTURE) ×4
SUT VIC AB 2-0 SH 27XBRD (SUTURE) ×4 IMPLANT
SUT VIC AB 3-0 54X BRD REEL (SUTURE) ×2 IMPLANT
SUT VIC AB 3-0 BRD 54 (SUTURE) ×2
SUT VIC AB 3-0 SH 27 (SUTURE) ×4
SUT VIC AB 3-0 SH 27X BRD (SUTURE) ×4 IMPLANT
SUT VIC AB 4-0 FS2 27 (SUTURE) ×8 IMPLANT
SUT VICRYL+ 3-0 144IN (SUTURE) ×4 IMPLANT
SUTURE ETHLN 4-0 FS2 18XMF BLK (SUTURE) IMPLANT
SWABSTK COMLB BENZOIN TINCTURE (MISCELLANEOUS) ×4 IMPLANT
SYR 3ML LL SCALE MARK (SYRINGE) ×4 IMPLANT
SYR CONTROL 10ML (SYRINGE) ×8 IMPLANT

## 2015-10-27 NOTE — H&P (Signed)
No change in clinical or exam.  For right inguinal hernia and right leg lipoma excision.

## 2015-10-27 NOTE — Telephone Encounter (Signed)
Per DPR, left detail message left for patient to return my call. 

## 2015-10-27 NOTE — OR Nursing (Signed)
Ice pack to abdominal incision.

## 2015-10-27 NOTE — Anesthesia Procedure Notes (Signed)
Procedure Name: LMA Insertion Date/Time: 10/27/2015 7:43 AM Performed by: Justus Memory Pre-anesthesia Checklist: Patient identified, Emergency Drugs available, Suction available, Patient being monitored and Timeout performed Patient Re-evaluated:Patient Re-evaluated prior to inductionOxygen Delivery Method: Circle system utilized Preoxygenation: Pre-oxygenation with 100% oxygen Intubation Type: IV induction Ventilation: Mask ventilation without difficulty LMA: LMA inserted LMA Size: 4.5 Placement Confirmation: positive ETCO2 Dental Injury: Teeth and Oropharynx as per pre-operative assessment

## 2015-10-27 NOTE — Transfer of Care (Signed)
Immediate Anesthesia Transfer of Care Note  Patient: Curtis Stewart  Procedure(s) Performed: Procedure(s): HERNIA REPAIR INGUINAL ADULT (Right) EXCISION LIPOMA (Right)  Patient Location: PACU  Anesthesia Type:General  Level of Consciousness: awake, alert  and oriented  Airway & Oxygen Therapy: Patient Spontanous Breathing  Post-op Assessment: Report given to RN and Post -op Vital signs reviewed and stable  Post vital signs: Reviewed and stable  Last Vitals:  Vitals:   10/27/15 0611  BP: (!) 163/73  Pulse: (!) 59  Resp: 14  Temp: 36.3 C    Last Pain:  Vitals:   10/27/15 0611  TempSrc: Tympanic         Complications: No apparent anesthesia complications

## 2015-10-27 NOTE — Telephone Encounter (Signed)
-----   Message from Pleas Koch, NP sent at 10/13/2015 12:04 PM EDT ----- Regarding: BP Will you please check on patient's blood pressure.

## 2015-10-27 NOTE — OR Nursing (Signed)
Dr Bary Castilla says OK for pt to go home.

## 2015-10-27 NOTE — Discharge Instructions (Signed)

## 2015-10-27 NOTE — Anesthesia Postprocedure Evaluation (Signed)
Anesthesia Post Note  Patient: Curtis Stewart  Procedure(s) Performed: Procedure(s) (LRB): HERNIA REPAIR INGUINAL ADULT (Right) EXCISION LIPOMA (Right)  Patient location during evaluation: PACU Anesthesia Type: General Level of consciousness: awake and alert and oriented Pain management: pain level controlled Vital Signs Assessment: post-procedure vital signs reviewed and stable Respiratory status: spontaneous breathing Cardiovascular status: blood pressure returned to baseline Anesthetic complications: no    Last Vitals:  Vitals:   10/27/15 1110 10/27/15 1156  BP:  140/73  Pulse: 65   Resp: 16   Temp: 36.2 C     Last Pain:  Vitals:   10/27/15 1110  TempSrc: Temporal                 Devri Kreher

## 2015-10-27 NOTE — Anesthesia Preprocedure Evaluation (Signed)
Anesthesia Evaluation  Patient identified by MRN, date of birth, ID band Patient awake    Reviewed: Allergy & Precautions, NPO status , Patient's Chart, lab work & pertinent test results  Airway Mallampati: II       Dental  (+) Upper Dentures, Chipped, Poor Dentition   Pulmonary former smoker,    Pulmonary exam normal        Cardiovascular hypertension, Normal cardiovascular exam     Neuro/Psych negative neurological ROS  negative psych ROS   GI/Hepatic Neg liver ROS, Hx of colon polyps   Endo/Other  negative endocrine ROS  Renal/GU negative Renal ROS  negative genitourinary   Musculoskeletal negative musculoskeletal ROS (+)   Abdominal Normal abdominal exam  (+)   Peds negative pediatric ROS (+)  Hematology negative hematology ROS (+)   Anesthesia Other Findings Hx of lung mass  Reproductive/Obstetrics                             Anesthesia Physical Anesthesia Plan  ASA: II  Anesthesia Plan: General   Post-op Pain Management:    Induction: Intravenous  Airway Management Planned: LMA  Additional Equipment:   Intra-op Plan:   Post-operative Plan: Extubation in OR  Informed Consent: I have reviewed the patients History and Physical, chart, labs and discussed the procedure including the risks, benefits and alternatives for the proposed anesthesia with the patient or authorized representative who has indicated his/her understanding and acceptance.   Dental advisory given  Plan Discussed with: CRNA and Surgeon  Anesthesia Plan Comments:         Anesthesia Quick Evaluation

## 2015-10-27 NOTE — Op Note (Signed)
Preoperative diagnosis: Right scrotal hernia, lipoma right anterior calf.  Postoperative diagnosis: Right scrotal hernia including terminal ileum and cecum. 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  Operative procedure: Repair of right inguinal hernia with large Ultra Pro mesh. Excision right anterior calf lipoma.  Operating surgeon: Loni Dolly, M.D.  Anesthesia: Gen. by LMA, Xylocaine 0.5% with Marcaine 0.25% with 1-200,000 epinephrine: 60 mL.  Estimated blood loss: Less than 10 mL.  Clinical note: This 60 year old male is a long-standing right anal hernia that is now become more symptomatic. He is also a gradual enlargement of a right anterior lateral calf mass. He is a native for excision and repair.  Operative note: With the patient under adequate general anesthesia and hair previously removed from the surgical field with clippers the area of the scrotum and penis was cleansed with Betadine solution in the lower abdomen cleansed with ChloraPrep. Initially a 6 cm in length incision was made after field block anesthesia was established. This was extended by 1 cm during the procedure. Hemostasis was electrocautery. The external Bleich was opened in direction of its fibers. The ilioinguinal and iliohypogastric nerves were identified. There is a huge indirect sac. This was opened and a proximally 4 feet of small intestine including a small nubbin of fibrofatty tissue on the antimesenteric border which was removed with a 3-0 Vicryl tie for hemostasis was removed. The cecum and appendix also was included in the hernia contents. These were all reduced without incident. The hernia sac was then dissected free of the cord structures. This was freed circumferentially at the area of the very stretched internal inguinal ring. The hernia sac was amputated and the edge oversewn with a 3-0 Vicryl baseball stitch to obliterate the lumen. The preperitoneal space was then cleared and a  large Ultra Pro mesh was placed. The external component was laid along the floor the inguinal canal. This was anchored to the pubic tubercle with a 0 Surgilon suture. The inferior border of the mesh was anchored to the inguinal ligament and the superior and medial borders anchored to the transverse abdominis aponeurosis. A lateral slit was made for cord passage. Toradol was placed in the wound. A 2-0 Vicryl running suture was used to close the external Bleich after returning the ilioinguinal and iliohypogastric nerves to their bed. Scarpa's flash was closed with a running 3-0 Vicryl. The skin was closed with a running 4-0 Vicryl subcuticular suture.  Benzoin, Steri-Strips, Telfa and tape dressing was applied.  Attention was turned to the right calf. The leg was rotated medially and held in place with wide-based tape. The area was prepped with ChloraPrep and draped. A 6 cm vertical incision was made over the mass. The skin was incised sharply and the remaining dissection completed electrocautery. A large multilobulated lipoma measuring 5 x 7 x 12 cm and extending into the anterior muscle compartment was excised. Hemostasis was with electrocautery and 3-0 Vicryl sutures. The muscle fascia was closed with a running 2-0 Vicryl suture. Adipose layer was closed with a running 3-0 Vicryl suture. The skin was closed with a running 4-0 Vicryl subcuticular suture. Benzoin and Steri-Strips are applied. Telfa pad, fluff gauze, Kerlix and an Ace wrap was then placed.  The patient tolerated the procedure well and was taken to the recovery room in stable condition.

## 2015-10-28 LAB — SURGICAL PATHOLOGY

## 2015-10-31 ENCOUNTER — Encounter: Payer: Self-pay | Admitting: General Surgery

## 2015-10-31 ENCOUNTER — Telehealth: Payer: Self-pay

## 2015-10-31 NOTE — Telephone Encounter (Signed)
-----   Message from Robert Bellow, MD sent at 10/28/2015  2:09 PM EDT ----- Please notify the patient that the pathology on the lipoma from the leg was fine. Just fat. ----- Message ----- From: Interface, Lab In Three Zero One Sent: 10/28/2015   1:44 PM To: Robert Bellow, MD

## 2015-11-01 NOTE — Telephone Encounter (Signed)
Message left for patient to return my call.  

## 2015-11-02 NOTE — Telephone Encounter (Signed)
Notified patient as instructed, patient pleased. Discussed follow-up appointments, patient agrees  

## 2015-11-03 ENCOUNTER — Encounter: Payer: Self-pay | Admitting: General Surgery

## 2015-11-03 ENCOUNTER — Ambulatory Visit (INDEPENDENT_AMBULATORY_CARE_PROVIDER_SITE_OTHER): Payer: BLUE CROSS/BLUE SHIELD | Admitting: General Surgery

## 2015-11-03 VITALS — BP 146/82 | HR 76 | Resp 12 | Ht 70.0 in | Wt 223.0 lb

## 2015-11-03 DIAGNOSIS — D1723 Benign lipomatous neoplasm of skin and subcutaneous tissue of right leg: Secondary | ICD-10-CM

## 2015-11-03 DIAGNOSIS — K4090 Unilateral inguinal hernia, without obstruction or gangrene, not specified as recurrent: Secondary | ICD-10-CM

## 2015-11-03 NOTE — Patient Instructions (Addendum)
Return in one month. Use heat

## 2015-11-03 NOTE — Progress Notes (Signed)
Patient ID: Curtis Stewart, male   DOB: 06-Aug-1955, 60 y.o.   MRN: [id number]  Chief Complaint  Patient presents with  . Routine Post Op    right inguinal hernia    HPI Curtis Stewart is a 60 y.o. malehere today for his post op right inguinal hernia repair and right leg excision done on 105/17.HPI  Past Medical History:  Diagnosis Date  . History of chicken pox   . Hypertension   . Inguinal hernia     Past Surgical History:  Procedure Laterality Date  . COLONOSCOPY  2011   [place]   . COLONOSCOPY WITH PROPOFOL N/A 09/20/2015   Procedure: COLONOSCOPY WITH PROPOFOL;  Surgeon: Robert Bellow, MD;  Location: [facility] ENDOSCOPY;  Service: Endoscopy;  Laterality: N/A;  . INGUINAL HERNIA REPAIR Right 10/27/2015   Procedure: HERNIA REPAIR INGUINAL ADULT;  Surgeon: Robert Bellow, MD;  Location: [facility] ORS;  Service: General;  Laterality: Right;  . LIPOMA EXCISION Right 10/27/2015   Procedure: EXCISION LIPOMA;  Surgeon: Robert Bellow, MD;  Location: [facility] ORS;  Service: General;  Laterality: Right;  Marland Kitchen VIDEO BRONCHOSCOPY Bilateral 03/18/2012   Procedure: VIDEO BRONCHOSCOPY WITH FLUORO;  Surgeon: Tanda Rockers, MD;  Location: [facility] ENDOSCOPY;  Service: Cardiopulmonary;  Laterality: Bilateral;    Family History  Problem Relation Age of Onset  . Diabetes Mother   . Hypertension Mother   . Diabetes Father   . Emphysema Father   . Cancer Father     bladder  . Hypertension Father     Social History Social History  Substance Use Topics  . Smoking status: Former Smoker    Packs/day: 1.00    Years: 20.00    Types: Cigarettes    Quit date: 11/23/2010  . Smokeless tobacco: Never Used  . Alcohol use 0.0 oz/week     Comment: rarely    No Known Allergies  Current Outpatient Prescriptions  Medication Sig Dispense Refill  . lisinopril-hydrochlorothiazide (PRINZIDE,ZESTORETIC) 20-25 MG tablet Take 1 tablet by mouth daily. 30 tablet 1   No current facility-administered medications for  this visit.     Review of Systems Review of Systems  Constitutional: Negative.   Respiratory: Negative.   Cardiovascular: Negative.     Blood pressure (!) 146/82, pulse 76, resp. rate 12, height 5\' 10"  (1.778 m), weight 223 lb (101.2 kg).  Physical Exam Physical Exam  Constitutional: He is oriented to person, place, and time. He appears well-developed and well-nourished.  Abdominal:  Right inguinal hernia repair is intact.  Genitourinary:     Musculoskeletal:       Legs: Neurological: He is alert and oriented to person, place, and time.  Skin: Skin is warm and dry.       Assessment    Doing well status post repair of a large indirect hernia including multiple feet of the terminal ileum, appendix and cecum.  No distal numbness at the site of intramuscular lipoma excision.    Plan    Local heat application should continue to both surgical sites.  Proper lifting technique reviewed.    Patient to return in one month.  This information has been scribed by Gaspar Cola CMA.    Robert Bellow 11/03/2015, 8:54 PM

## 2015-12-05 ENCOUNTER — Encounter: Payer: Self-pay | Admitting: General Surgery

## 2015-12-05 ENCOUNTER — Ambulatory Visit (INDEPENDENT_AMBULATORY_CARE_PROVIDER_SITE_OTHER): Payer: BLUE CROSS/BLUE SHIELD | Admitting: General Surgery

## 2015-12-05 VITALS — BP 130/78 | HR 76 | Resp 14 | Ht 70.0 in | Wt 235.0 lb

## 2015-12-05 DIAGNOSIS — D1723 Benign lipomatous neoplasm of skin and subcutaneous tissue of right leg: Secondary | ICD-10-CM

## 2015-12-05 DIAGNOSIS — K4090 Unilateral inguinal hernia, without obstruction or gangrene, not specified as recurrent: Secondary | ICD-10-CM

## 2015-12-05 DIAGNOSIS — K635 Polyp of colon: Secondary | ICD-10-CM

## 2015-12-05 MED ORDER — NEOMYCIN SULFATE 500 MG PO TABS: ORAL_TABLET | ORAL | 0 refills | Status: DC

## 2015-12-05 MED ORDER — POLYETHYLENE GLYCOL 3350 17 GM/SCOOP PO POWD: 1.0000 | Freq: Once | ORAL | 0 refills | Status: AC

## 2015-12-05 MED ORDER — METRONIDAZOLE 500 MG PO TABS: ORAL_TABLET | ORAL | 0 refills | Status: AC

## 2015-12-05 NOTE — Progress Notes (Signed)
Patient ID: Curtis Stewart, male   DOB: Mar 17, 1955, 60 y.o.   MRN: [id number]  Chief Complaint  Patient presents with  . Follow-up    HPI Curtis Stewart is a 60 y.o. male  here today for his post op right inguinal hernia repair and right leg excision done on 105/17 HPI  Past Medical History:  Diagnosis Date  . History of chicken pox   . Hypertension   . Inguinal hernia     Past Surgical History:  Procedure Laterality Date  . COLONOSCOPY  2011   [place]   . COLONOSCOPY WITH PROPOFOL N/A 09/20/2015   Procedure: COLONOSCOPY WITH PROPOFOL;  Surgeon: Robert Bellow, MD;  Location: [facility] ENDOSCOPY;  Service: Endoscopy;  Laterality: N/A;  . INGUINAL HERNIA REPAIR Right 10/27/2015   Procedure: HERNIA REPAIR INGUINAL ADULT;  Surgeon: Robert Bellow, MD;  Location: [facility] ORS;  Service: General;  Laterality: Right;  . LIPOMA EXCISION Right 10/27/2015   Procedure: EXCISION LIPOMA;  Surgeon: Robert Bellow, MD;  Location: [facility] ORS;  Service: General;  Laterality: Right;  Marland Kitchen VIDEO BRONCHOSCOPY Bilateral 03/18/2012   Procedure: VIDEO BRONCHOSCOPY WITH FLUORO;  Surgeon: Tanda Rockers, MD;  Location: [facility] ENDOSCOPY;  Service: Cardiopulmonary;  Laterality: Bilateral;    Family History  Problem Relation Age of Onset  . Diabetes Mother   . Hypertension Mother   . Diabetes Father   . Emphysema Father   . Cancer Father     bladder  . Hypertension Father     Social History Social History  Substance Use Topics  . Smoking status: Former Smoker    Packs/day: 1.00    Years: 20.00    Types: Cigarettes    Quit date: 11/23/2010  . Smokeless tobacco: Never Used  . Alcohol use 0.0 oz/week     Comment: rarely    No Known Allergies  Current Outpatient Prescriptions  Medication Sig Dispense Refill  . lisinopril-hydrochlorothiazide (PRINZIDE,ZESTORETIC) 20-25 MG tablet Take 1 tablet by mouth daily. 30 tablet 1  . [START ON 01/05/2016] metroNIDAZOLE (FLAGYL) 500 MG tablet Take one (1) tablet  at 6 pm and one (1) tablet at 11 pm the evening prior to surgery. 2 tablet 0  . [START ON 01/05/2016] neomycin (MYCIFRADIN) 500 MG tablet Take two (2) tablets at 6 pm and two (2) tablets at 11 pm the evening prior to surgery. 4 tablet 0  . polyethylene glycol powder (GLYCOLAX/MIRALAX) powder Take 255 g by mouth once. 255 grams one bottle for bowel prep 255 g 0   No current facility-administered medications for this visit.     Review of Systems Review of Systems  Constitutional: Negative.   Respiratory: Negative.   Cardiovascular: Negative.     Blood pressure 130/78, pulse 76, resp. rate 14, height 5\' 10"  (1.778 m), weight 235 lb (106.6 kg).  Physical Exam Physical Exam  Constitutional: He is oriented to person, place, and time. He appears well-developed and well-nourished.  Cardiovascular: Normal rate, regular rhythm and normal heart sounds.   Pulmonary/Chest: Effort normal and breath sounds normal.  Abdominal: Soft. Bowel sounds are normal. There is no tenderness.  Right inguinal incision is clean and healing well.   Genitourinary:     Neurological: He is alert and oriented to person, place, and time.  Skin: Skin is warm and dry.       Data Reviewed Colonoscopy of 09/20/2015:  DIAGNOSIS:  A. ILEOCECAL VALVE POLYP; HOT SNARE:  - TUBULOVILLOUS ADENOMA, MULTIPLE FRAGMENTS.  - NEGATIVE FOR  HIGH-GRADE DYSPLASIA AND MALIGNANCY.    Assessment    Doing well status post right inguinal hernia repair.  Residual thickening secondary to long-standing hernia. Asymptomatic.  4 cm polyp involving the ileocecal valve not amenable to endoscopic resection.    Plan    Patient to be scheduled for right colectomy.  Patient's surgery has been scheduled for 01-06-16 at [facility]. This patient will be asked to complete a bowel prep with antibiotics. History and physical will be updated the morning of procedure.     This information has been scribed by Gaspar Cola CMA.    Robert Bellow 12/05/2015, 8:04 PM

## 2015-12-05 NOTE — Patient Instructions (Addendum)
Patient to be scheduled for right colectomy

## 2015-12-20 ENCOUNTER — Other Ambulatory Visit: Payer: Self-pay | Admitting: Primary Care

## 2015-12-20 DIAGNOSIS — I10 Essential (primary) hypertension: Secondary | ICD-10-CM

## 2015-12-28 ENCOUNTER — Encounter
Admission: RE | Admit: 2015-12-28 | Discharge: 2015-12-28 | Disposition: A | Discharge status: Home / Self Care | Payer: BLUE CROSS/BLUE SHIELD | Source: Ambulatory Visit | Attending: General Surgery | Admitting: General Surgery

## 2015-12-28 NOTE — Patient Instructions (Signed)
  Your procedure is scheduled on: 01-06-16 (FRIDAY) Report to Same Day Surgery 2nd floor medical mall [facility] Entrance-take elevator on left to 2nd floor.  Check in with surgery information desk.) To find out your arrival time please call [phone] between 1PM - 3PM on 01-05-16 [facility])  Remember: Instructions that are not followed completely may result in serious medical risk, up to and including death, or upon the discretion of your surgeon and anesthesiologist your surgery may need to be rescheduled.    _x___ 1. Do not eat food or drink liquids after midnight. No gum chewing or hard candies.     __x__ 2. No Alcohol for 24 hours before or after surgery.   __x__3. No Smoking for 24 prior to surgery.   ____  4. Bring all medications with you on the day of surgery if instructed.    __x__ 5. Notify your doctor if there is any change in your medical condition     (cold, fever, infections).     Do not wear jewelry, make-up, hairpins, clips or nail polish.  Do not wear lotions, powders, or perfumes. You may wear deodorant.  Do not shave 48 hours prior to surgery. Men may shave face and neck.  Do not bring valuables to the hospital.    [facility] is not responsible for any belongings or valuables.               Contacts, dentures or bridgework may not be worn into surgery.  Leave your suitcase in the car. After surgery it may be brought to your room.  For patients admitted to the hospital, discharge time is determined by your treatment team.   Patients discharged the day of surgery will not be allowed to drive home.  You will need someone to drive you home and stay with you the night of your procedure.    Please read over the following fact sheets that you were given:   [facility] Preparing for Surgery and or MRSA Information   ____ Take these medicines the morning of surgery with A SIP OF WATER:    1. FOLLOW DR, BYRNETT'S BOWEL PREP  INSTRUCTIONS  2.  3.  4.  5.  6.  ____Fleets enema or Magnesium Citrate as directed   _x___ Use CHG Soap or sage wipes as directed on instruction sheet   ____ Use inhalers on the day of surgery and bring to hospital day of surgery  ____ Stop metformin 2 days prior to surgery    ____ Take 1/2 of usual insulin dose the night before surgery and none on the morning of surgery.   ____ Stop Aspirin, Coumadin, Pllavix ,Eliquis, Effient, or Pradaxa  x__ Stop Anti-inflammatories such as Advil, Aleve, Ibuprofen, Motrin, Naproxen,          Naprosyn, Goodies powders or aspirin products. Ok to take Tylenol.   ____ Stop supplements until after surgery.    ____ Bring C-Pap to the hospital.

## 2016-01-03 ENCOUNTER — Encounter
Admission: RE | Admit: 2016-01-03 | Discharge: 2016-01-03 | Disposition: A | Discharge status: Home / Self Care | Payer: BLUE CROSS/BLUE SHIELD | Source: Ambulatory Visit | Attending: General Surgery | Admitting: General Surgery

## 2016-01-03 DIAGNOSIS — Z01812 Encounter for preprocedural laboratory examination: Secondary | ICD-10-CM

## 2016-01-03 LAB — SURGICAL PCR SCREEN
MRSA, PCR: NEGATIVE
STAPHYLOCOCCUS AUREUS: NEGATIVE

## 2016-01-03 LAB — CBC
HCT: 42.1 % (ref 40.0–52.0)
Hemoglobin: 14.4 g/dL (ref 13.0–18.0)
MCH: 29.8 pg (ref 26.0–34.0)
MCHC: 34.1 g/dL (ref 32.0–36.0)
MCV: 87.3 fL (ref 80.0–100.0)
PLATELETS: 222 10*3/uL (ref 150–440)
RBC: 4.82 MIL/uL (ref 4.40–5.90)
RDW: 14.7 % — AB (ref 11.5–14.5)
WBC: 5.2 10*3/uL (ref 3.8–10.6)

## 2016-01-03 LAB — POTASSIUM: POTASSIUM: 3.8 mmol/L (ref 3.5–5.1)

## 2016-01-06 ENCOUNTER — Encounter: Payer: Self-pay | Admitting: *Deleted

## 2016-01-06 ENCOUNTER — Inpatient Hospital Stay
Admission: RE | Admit: 2016-01-06 | Discharge: 2016-01-08 | DRG: 331 | Disposition: A | Discharge status: Home / Self Care | Payer: BLUE CROSS/BLUE SHIELD | Source: Ambulatory Visit | Attending: General Surgery | Admitting: General Surgery

## 2016-01-06 ENCOUNTER — Encounter
Admission: RE | Disposition: A | Discharge status: Home / Self Care | Payer: Self-pay | Source: Ambulatory Visit | Attending: General Surgery

## 2016-01-06 ENCOUNTER — Inpatient Hospital Stay: Payer: BLUE CROSS/BLUE SHIELD | Admitting: Anesthesiology

## 2016-01-06 DIAGNOSIS — K635 Polyp of colon: Secondary | ICD-10-CM | POA: Diagnosis present

## 2016-01-06 DIAGNOSIS — Z79899 Other long term (current) drug therapy: Secondary | ICD-10-CM

## 2016-01-06 DIAGNOSIS — Z87891 Personal history of nicotine dependence: Secondary | ICD-10-CM | POA: Diagnosis not present

## 2016-01-06 DIAGNOSIS — D120 Benign neoplasm of cecum: Secondary | ICD-10-CM

## 2016-01-06 HISTORY — PX: LAPAROSCOPIC RIGHT COLECTOMY: SHX5925

## 2016-01-06 SURGERY — COLECTOMY, RIGHT, LAPAROSCOPIC
Anesthesia: General | Laterality: Right | Wound class: Clean Contaminated

## 2016-01-06 MED ORDER — ONDANSETRON 4 MG PO TBDP: 4.0000 mg | ORAL_TABLET | Freq: Four times a day (QID) | ORAL | Status: DC | PRN

## 2016-01-06 MED ORDER — LACTATED RINGERS IV SOLN
INTRAVENOUS | Status: DC
  Administered 2016-01-06 (×2): via INTRAVENOUS

## 2016-01-06 MED ORDER — ENOXAPARIN SODIUM 40 MG/0.4ML ~~LOC~~ SOLN
40.0000 mg | SUBCUTANEOUS | Status: DC
  Administered 2016-01-07 – 2016-01-08 (×2): 40 mg via SUBCUTANEOUS
  Filled 2016-01-06 (×2): qty 0.4

## 2016-01-06 MED ORDER — IBUPROFEN 400 MG PO TABS
600.0000 mg | ORAL_TABLET | Freq: Four times a day (QID) | ORAL | Status: DC | PRN
  Filled 2016-01-06: qty 2

## 2016-01-06 MED ORDER — ALVIMOPAN 12 MG PO CAPS
ORAL_CAPSULE | ORAL | Status: AC
  Filled 2016-01-06: qty 1

## 2016-01-06 MED ORDER — FENTANYL CITRATE (PF) 100 MCG/2ML IJ SOLN
25.0000 ug | INTRAMUSCULAR | Status: DC | PRN
  Administered 2016-01-06 (×4): 25 ug via INTRAVENOUS

## 2016-01-06 MED ORDER — PROPOFOL 10 MG/ML IV BOLUS
INTRAVENOUS | Status: DC | PRN
  Administered 2016-01-06: 180 mg via INTRAVENOUS

## 2016-01-06 MED ORDER — FAMOTIDINE 20 MG PO TABS
20.0000 mg | ORAL_TABLET | Freq: Once | ORAL | Status: AC
  Administered 2016-01-06: 20 mg via ORAL

## 2016-01-06 MED ORDER — ACETAMINOPHEN 10 MG/ML IV SOLN
INTRAVENOUS | Status: DC | PRN
  Administered 2016-01-06: 1000 mg via INTRAVENOUS

## 2016-01-06 MED ORDER — LIDOCAINE HCL (CARDIAC) 20 MG/ML IV SOLN
INTRAVENOUS | Status: DC | PRN
  Administered 2016-01-06: 100 mg via INTRAVENOUS

## 2016-01-06 MED ORDER — DEXAMETHASONE SODIUM PHOSPHATE 10 MG/ML IJ SOLN
INTRAMUSCULAR | Status: DC | PRN
  Administered 2016-01-06: 5 mg via INTRAVENOUS

## 2016-01-06 MED ORDER — MIDAZOLAM HCL 2 MG/2ML IJ SOLN
INTRAMUSCULAR | Status: DC | PRN
  Administered 2016-01-06: 2 mg via INTRAVENOUS

## 2016-01-06 MED ORDER — DEXTROSE IN LACTATED RINGERS 5 % IV SOLN
INTRAVENOUS | Status: DC
  Administered 2016-01-06 – 2016-01-08 (×4): via INTRAVENOUS

## 2016-01-06 MED ORDER — SUGAMMADEX SODIUM 200 MG/2ML IV SOLN
INTRAVENOUS | Status: DC | PRN
  Administered 2016-01-06: 200 mg via INTRAVENOUS

## 2016-01-06 MED ORDER — SODIUM CHLORIDE 0.9 % IV SOLN
1.0000 g | INTRAVENOUS | Status: AC
  Administered 2016-01-06: 1 g via INTRAVENOUS
  Filled 2016-01-06: qty 1

## 2016-01-06 MED ORDER — MORPHINE SULFATE (PF) 4 MG/ML IV SOLN: 2.0000 mg | INTRAVENOUS | Status: DC | PRN

## 2016-01-06 MED ORDER — LISINOPRIL 20 MG PO TABS
20.0000 mg | ORAL_TABLET | Freq: Every day | ORAL | Status: DC
  Administered 2016-01-06 – 2016-01-08 (×3): 20 mg via ORAL
  Filled 2016-01-06 (×3): qty 1

## 2016-01-06 MED ORDER — BUPIVACAINE HCL (PF) 0.5 % IJ SOLN
INTRAMUSCULAR | Status: AC
  Filled 2016-01-06: qty 30

## 2016-01-06 MED ORDER — ALVIMOPAN 12 MG PO CAPS
12.0000 mg | ORAL_CAPSULE | Freq: Once | ORAL | Status: AC
  Administered 2016-01-06: 12 mg via ORAL

## 2016-01-06 MED ORDER — HYDROCHLOROTHIAZIDE 25 MG PO TABS
25.0000 mg | ORAL_TABLET | Freq: Every day | ORAL | Status: DC
  Administered 2016-01-06 – 2016-01-08 (×3): 25 mg via ORAL
  Filled 2016-01-06 (×3): qty 1

## 2016-01-06 MED ORDER — ACETAMINOPHEN 10 MG/ML IV SOLN
1000.0000 mg | Freq: Four times a day (QID) | INTRAVENOUS | Status: AC
  Administered 2016-01-06 – 2016-01-07 (×2): 1000 mg via INTRAVENOUS
  Filled 2016-01-06 (×4): qty 100

## 2016-01-06 MED ORDER — FENTANYL CITRATE (PF) 100 MCG/2ML IJ SOLN
INTRAMUSCULAR | Status: AC
  Administered 2016-01-06: 25 ug via INTRAVENOUS
  Filled 2016-01-06: qty 2

## 2016-01-06 MED ORDER — LISINOPRIL-HYDROCHLOROTHIAZIDE 20-25 MG PO TABS: 1.0000 | ORAL_TABLET | Freq: Every day | ORAL | Status: DC

## 2016-01-06 MED ORDER — FENTANYL CITRATE (PF) 100 MCG/2ML IJ SOLN
INTRAMUSCULAR | Status: DC | PRN
  Administered 2016-01-06: 100 ug via INTRAVENOUS

## 2016-01-06 MED ORDER — OXYCODONE HCL 5 MG PO TABS
5.0000 mg | ORAL_TABLET | ORAL | Status: DC | PRN
  Administered 2016-01-06 – 2016-01-07 (×2): 5 mg via ORAL
  Filled 2016-01-06 (×3): qty 1

## 2016-01-06 MED ORDER — ACETAMINOPHEN 10 MG/ML IV SOLN
INTRAVENOUS | Status: AC
  Filled 2016-01-06: qty 100

## 2016-01-06 MED ORDER — ROCURONIUM BROMIDE 100 MG/10ML IV SOLN
INTRAVENOUS | Status: DC | PRN
  Administered 2016-01-06: 20 mg via INTRAVENOUS
  Administered 2016-01-06 (×2): 10 mg via INTRAVENOUS

## 2016-01-06 MED ORDER — DEXTROSE 5 % IN LACTATED RINGERS IV BOLUS
500.0000 mL | Freq: Once | INTRAVENOUS | Status: AC
  Administered 2016-01-06: 500 mL via INTRAVENOUS

## 2016-01-06 MED ORDER — OXYCODONE HCL 5 MG/5ML PO SOLN: 5.0000 mg | Freq: Once | ORAL | Status: DC | PRN

## 2016-01-06 MED ORDER — KETOROLAC TROMETHAMINE 30 MG/ML IJ SOLN
INTRAMUSCULAR | Status: DC | PRN
  Administered 2016-01-06: 30 mg via INTRAVENOUS

## 2016-01-06 MED ORDER — EPHEDRINE SULFATE 50 MG/ML IJ SOLN
INTRAMUSCULAR | Status: DC | PRN
  Administered 2016-01-06: 10 mg via INTRAVENOUS

## 2016-01-06 MED ORDER — SUCCINYLCHOLINE CHLORIDE 20 MG/ML IJ SOLN
INTRAMUSCULAR | Status: DC | PRN
  Administered 2016-01-06: 100 mg via INTRAVENOUS

## 2016-01-06 MED ORDER — BUPIVACAINE HCL (PF) 0.5 % IJ SOLN
INTRAMUSCULAR | Status: DC | PRN
  Administered 2016-01-06: 30 mL

## 2016-01-06 MED ORDER — ALVIMOPAN 12 MG PO CAPS
12.0000 mg | ORAL_CAPSULE | Freq: Two times a day (BID) | ORAL | Status: DC
  Administered 2016-01-06 – 2016-01-07 (×2): 12 mg via ORAL
  Filled 2016-01-06 (×2): qty 1

## 2016-01-06 MED ORDER — FAMOTIDINE 20 MG PO TABS
ORAL_TABLET | ORAL | Status: AC
  Filled 2016-01-06: qty 1

## 2016-01-06 MED ORDER — ONDANSETRON HCL 4 MG/2ML IJ SOLN: 4.0000 mg | Freq: Four times a day (QID) | INTRAMUSCULAR | Status: DC | PRN

## 2016-01-06 MED ORDER — HYDROMORPHONE HCL 1 MG/ML IJ SOLN
INTRAMUSCULAR | Status: DC | PRN
  Administered 2016-01-06 (×2): .6 mg via INTRAVENOUS

## 2016-01-06 MED ORDER — OXYCODONE HCL 5 MG PO TABS: 5.0000 mg | ORAL_TABLET | Freq: Once | ORAL | Status: DC | PRN

## 2016-01-06 SURGICAL SUPPLY — 72 items
APPLIER CLIP ROT 10 11.4 M/L (STAPLE)
BLADE SURG 10 STRL SS SAFETY (BLADE) ×3 IMPLANT
BLADE SURG 11 STRL SS SAFETY (MISCELLANEOUS) ×3 IMPLANT
CANISTER SUCT 1200ML W/VALVE (MISCELLANEOUS) ×3 IMPLANT
CANNULA DILATOR 10 W/SLV (CANNULA) ×2 IMPLANT
CANNULA DILATOR 10MM W/SLV (CANNULA) ×1
CATH TRAY 16F METER LATEX (MISCELLANEOUS) ×3 IMPLANT
CHLORAPREP W/TINT 26ML (MISCELLANEOUS) ×3 IMPLANT
CLIP APPLIE ROT 10 11.4 M/L (STAPLE) IMPLANT
CLOSURE WOUND 1/2 X4 (GAUZE/BANDAGES/DRESSINGS) ×1
COVER CLAMP SIL LG PBX B (MISCELLANEOUS) IMPLANT
DEVICE HAND ACCESS DEXTUS (MISCELLANEOUS) IMPLANT
DRAPE LAP W/FLUID (DRAPES) IMPLANT
DRAPE UNDER BUTTOCK W/FLU (DRAPES) IMPLANT
DRSG OPSITE POSTOP 4X10 (GAUZE/BANDAGES/DRESSINGS) IMPLANT
DRSG OPSITE POSTOP 4X8 (GAUZE/BANDAGES/DRESSINGS) ×9 IMPLANT
DRSG TEGADERM 2-3/8X2-3/4 SM (GAUZE/BANDAGES/DRESSINGS) ×9 IMPLANT
DRSG TEGADERM 4X4.75 (GAUZE/BANDAGES/DRESSINGS) ×3 IMPLANT
DRSG TELFA 3X8 NADH (GAUZE/BANDAGES/DRESSINGS) ×3 IMPLANT
ELECT BLADE 6.5 EXT (BLADE) ×3 IMPLANT
ELECT CAUTERY BLADE 6.4 (BLADE) ×3 IMPLANT
ELECT REM PT RETURN 9FT ADLT (ELECTROSURGICAL) ×3
ELECTRODE REM PT RTRN 9FT ADLT (ELECTROSURGICAL) ×1 IMPLANT
FILTER LAP SMOKE EVAC STRL (MISCELLANEOUS) ×3 IMPLANT
GLOVE BIO SURGEON STRL SZ7 (GLOVE) ×9 IMPLANT
GLOVE BIO SURGEON STRL SZ7.5 (GLOVE) ×9 IMPLANT
GLOVE INDICATOR 8.0 STRL GRN (GLOVE) ×9 IMPLANT
GOWN STRL REUS W/ TWL LRG LVL3 (GOWN DISPOSABLE) ×6 IMPLANT
GOWN STRL REUS W/TWL LRG LVL3 (GOWN DISPOSABLE) ×12
HANDLE YANKAUER SUCT BULB TIP (MISCELLANEOUS) ×3 IMPLANT
HOLDER FOLEY CATH W/STRAP (MISCELLANEOUS) ×3 IMPLANT
IRRIGATION STRYKERFLOW (MISCELLANEOUS) IMPLANT
IRRIGATOR STRYKERFLOW (MISCELLANEOUS)
IV LACTATED RINGERS 1000ML (IV SOLUTION) IMPLANT
KIT RM TURNOVER STRD PROC AR (KITS) ×3 IMPLANT
LABEL OR SOLS (LABEL) ×3 IMPLANT
NDL INSUFF ACCESS 14 VERSASTEP (NEEDLE) ×3 IMPLANT
NS IRRIG 500ML POUR BTL (IV SOLUTION) ×6 IMPLANT
PACK COLON CLEAN CLOSURE (MISCELLANEOUS) ×3 IMPLANT
PACK LAP CHOLECYSTECTOMY (MISCELLANEOUS) ×3 IMPLANT
PAD PREP 24X41 OB/GYN DISP (PERSONAL CARE ITEMS) IMPLANT
PENCIL ELECTRO HAND CTR (MISCELLANEOUS) ×3 IMPLANT
PROT DEXTUS HAND ACCESS (MISCELLANEOUS)
RELOAD PROXIMATE 75MM BLUE (ENDOMECHANICALS) ×3 IMPLANT
RETAINER VISCERA MED (MISCELLANEOUS) IMPLANT
RETRACTOR FIXED LENGTH SML (MISCELLANEOUS) IMPLANT
RETRACTOR WOUND ALXS 18CM MED (MISCELLANEOUS) IMPLANT
RTRCTR WOUND ALEXIS O 18CM MED (MISCELLANEOUS)
SCISSORS METZENBAUM CVD 33 (INSTRUMENTS) ×3 IMPLANT
SEAL FOR SCOPE WARMER C3101 (MISCELLANEOUS) IMPLANT
SET YANKAUER POOLE SUCT (MISCELLANEOUS) ×3 IMPLANT
SHEARS HARMONIC ACE PLUS 36CM (ENDOMECHANICALS) ×3 IMPLANT
SPONGE LAP 18X18 5 PK (GAUZE/BANDAGES/DRESSINGS) ×3 IMPLANT
STAPLER PROXIMATE 75MM BLUE (STAPLE) ×3 IMPLANT
STRIP CLOSURE SKIN 1/2X4 (GAUZE/BANDAGES/DRESSINGS) ×2 IMPLANT
SUT PROLENE 0 CT 1 30 (SUTURE) IMPLANT
SUT SILK 2 0 (SUTURE)
SUT SILK 2-0 30XBRD TIE 12 (SUTURE) IMPLANT
SUT SILK 3-0 (SUTURE) ×3 IMPLANT
SUT VIC AB 2-0 BRD 54 (SUTURE) ×3 IMPLANT
SUT VIC AB 2-0 CT1 27 (SUTURE) ×2
SUT VIC AB 2-0 CT1 TAPERPNT 27 (SUTURE) ×1 IMPLANT
SUT VIC AB 3-0 54X BRD REEL (SUTURE) IMPLANT
SUT VIC AB 3-0 BRD 54 (SUTURE)
SUT VIC AB 3-0 SH 27 (SUTURE) ×2
SUT VIC AB 3-0 SH 27X BRD (SUTURE) ×1 IMPLANT
SUT VIC AB 4-0 FS2 27 (SUTURE) ×3 IMPLANT
TROCAR XCEL NON-BLD 11X100MML (ENDOMECHANICALS) ×3 IMPLANT
TROCAR XCEL NON-BLD 5MMX100MML (ENDOMECHANICALS) ×3 IMPLANT
TROCAR XCEL UNIV SLVE 11M 100M (ENDOMECHANICALS) ×6 IMPLANT
TUBING INSUFFLATOR HEATED (MISCELLANEOUS) ×3 IMPLANT
WATER STERILE IRR 1000ML POUR (IV SOLUTION) IMPLANT

## 2016-01-06 NOTE — Anesthesia Preprocedure Evaluation (Signed)
Anesthesia Evaluation  Patient identified by MRN, date of birth, ID band Patient awake    Reviewed: Allergy & Precautions, H&P , NPO status , Patient's Chart, lab work & pertinent test results  Airway Mallampati: III  TM Distance: <3 FB Neck ROM: limited    Dental no notable dental hx. (+) Poor Dentition, Chipped, Missing, Upper Dentures   Pulmonary neg shortness of breath, former smoker,    Pulmonary exam normal breath sounds clear to auscultation       Cardiovascular Exercise Tolerance: Good hypertension, (-) angina(-) Past MI and (-) DOE Normal cardiovascular exam Rhythm:regular Rate:Normal     Neuro/Psych negative neurological ROS  negative psych ROS   GI/Hepatic negative GI ROS, Neg liver ROS, neg GERD  ,  Endo/Other  negative endocrine ROS  Renal/GU      Musculoskeletal   Abdominal   Peds  Hematology negative hematology ROS (+)   Anesthesia Other Findings Past Medical History: No date: History of chicken pox No date: Hypertension No date: Inguinal hernia  Past Surgical History: 2011: COLONOSCOPY     Comment: [place]  09/20/2015: COLONOSCOPY WITH PROPOFOL N/A     Comment: Procedure: COLONOSCOPY WITH PROPOFOL;                Surgeon: Robert Bellow, MD;  Location: [facility]              ENDOSCOPY;  Service: Endoscopy;  Laterality:               N/A; 10/27/2015: INGUINAL HERNIA REPAIR Right     Comment: Procedure: HERNIA REPAIR INGUINAL ADULT;                Surgeon: Robert Bellow, MD;  Location: [place]              ORS;  Service: General;  Laterality: Right; 10/27/2015: LIPOMA EXCISION Right     Comment: Procedure: EXCISION LIPOMA;  Surgeon: Robert Bellow, MD;  Location: [facility] ORS;  Service:               General;  Laterality: Right; 03/18/2012: VIDEO BRONCHOSCOPY Bilateral     Comment: Procedure: [place];                Surgeon: Tanda Rockers, MD;  Location:  [facility]               ENDOSCOPY;  Service: Cardiopulmonary;                Laterality: Bilateral;  BMI    Body Mass Index:  33.00 kg/m      Reproductive/Obstetrics negative OB ROS                             Anesthesia Physical Anesthesia Plan  ASA: III  Anesthesia Plan: General ETT   Post-op Pain Management:    Induction:   Airway Management Planned:   Additional Equipment:   Intra-op Plan:   Post-operative Plan:   Informed Consent: I have reviewed the patients History and Physical, chart, labs and discussed the procedure including the risks, benefits and alternatives for the proposed anesthesia with the patient or authorized representative who has indicated his/her understanding and acceptance.   Dental Advisory Given  Plan Discussed with: Anesthesiologist, CRNA and Surgeon  Anesthesia Plan Comments:         Anesthesia  Quick Evaluation

## 2016-01-06 NOTE — H&P (Signed)
BLONG PIKE [id number] 01/24/1955     HPI: 60 y.o male identified with large polyp on the back side of the ileo-cecal valve at the time of screening colonoscopy. Not amenable to endoscopic resection. For right hemi-colectomy. Tolerated prep well.   Prescriptions Prior to Admission  Medication Sig Dispense Refill Last Dose  . lisinopril-hydrochlorothiazide (PRINZIDE,ZESTORETIC) 20-25 MG tablet TAKE 1 TABLET BY MOUTH DAILY. 30 tablet 2 01/05/2016 at Unknown time  . neomycin (MYCIFRADIN) 500 MG tablet Take two (2) tablets at 6 pm and two (2) tablets at 11 pm the evening prior to surgery. 4 tablet 0 01/05/2016 at Unknown time   No Known Allergies Past Medical History:  Diagnosis Date  . History of chicken pox   . Hypertension   . Inguinal hernia    Past Surgical History:  Procedure Laterality Date  . COLONOSCOPY  2011   [place]   . COLONOSCOPY WITH PROPOFOL N/A 09/20/2015   Procedure: COLONOSCOPY WITH PROPOFOL;  Surgeon: Robert Bellow, MD;  Location: [facility] ENDOSCOPY;  Service: Endoscopy;  Laterality: N/A;  . INGUINAL HERNIA REPAIR Right 10/27/2015   Procedure: HERNIA REPAIR INGUINAL ADULT;  Surgeon: Robert Bellow, MD;  Location: [facility] ORS;  Service: General;  Laterality: Right;  . LIPOMA EXCISION Right 10/27/2015   Procedure: EXCISION LIPOMA;  Surgeon: Robert Bellow, MD;  Location: [facility] ORS;  Service: General;  Laterality: Right;  Marland Kitchen VIDEO BRONCHOSCOPY Bilateral 03/18/2012   Procedure: VIDEO BRONCHOSCOPY WITH FLUORO;  Surgeon: Tanda Rockers, MD;  Location: [facility] ENDOSCOPY;  Service: Cardiopulmonary;  Laterality: Bilateral;   Social History   Social History  . Marital status: Single    Spouse name: N/A  . Number of children: 2  . Years of education: N/A   Occupational History  . pressman    Social History Main Topics  . Smoking status: Former Smoker    Packs/day: 1.00    Years: 20.00    Types: Cigarettes    Quit date: 11/23/2010  . Smokeless tobacco: Never Used  .  Alcohol use 0.0 oz/week     Comment: rarely  . Drug use: No  . Sexual activity: Not on file   Other Topics Concern  . Not on file   Social History Narrative   Single.   2 children.    Works in Academic librarian.   Enjoys playing softball, bowling, golfing.    Social History   Social History Narrative   Single.   2 children.    Works in Academic librarian.   Enjoys playing softball, bowling, golfing.      ROS: Negative.     PE: HEENT: Negative. Lungs: Clear. Cardio: RR. ABD: Soft. Right groin: marked decrease in thickening s/p RIH repair.    Assessment/Plan:  Proceed with planned right hemicolectomy.   Robert Bellow 01/06/2016

## 2016-01-06 NOTE — Anesthesia Postprocedure Evaluation (Signed)
Anesthesia Post Note  Patient: Curtis Stewart  Procedure(s) Performed: Procedure(s) (LRB): LAPAROSCOPIC RIGHT COLECTOMY (Right)  Patient location during evaluation: PACU Anesthesia Type: General Level of consciousness: awake and alert Pain management: pain level controlled Vital Signs Assessment: post-procedure vital signs reviewed and stable Respiratory status: spontaneous breathing, nonlabored ventilation, respiratory function stable and patient connected to nasal cannula oxygen Cardiovascular status: blood pressure returned to baseline and stable Postop Assessment: no signs of nausea or vomiting Anesthetic complications: no    Last Vitals:  Vitals:   01/06/16 1138 01/06/16 1300  BP: 135/84   Pulse: 74   Resp: 16   Temp: 36.7 C 36.9 C    Last Pain:  Vitals:   01/06/16 1138  TempSrc: Oral  PainSc:                  Precious Haws Piscitello

## 2016-01-06 NOTE — Progress Notes (Signed)
Notified MD of pt drop in BP and c/o chills. Provided with iboproufen although he is afebrile. To address BP will give one time bolus of 500 cc. Will re check BP at completion and monitor closely.

## 2016-01-06 NOTE — Anesthesia Procedure Notes (Signed)
Procedure Name: Intubation Date/Time: 01/06/2016 7:37 AM Performed by: Justus Memory Pre-anesthesia Checklist: Emergency Drugs available, Suction available and Patient being monitored Patient Re-evaluated:Patient Re-evaluated prior to inductionOxygen Delivery Method: Circle system utilized Preoxygenation: Pre-oxygenation with 100% oxygen Intubation Type: IV induction Ventilation: Mask ventilation with difficulty Laryngoscope Size: Mac and 3 Grade View: Grade I Tube type: Oral Tube size: 7.0 mm Number of attempts: 1 Airway Equipment and Method: Patient positioned with wedge pillow and Stylet Placement Confirmation: ETT inserted through vocal cords under direct vision,  positive ETCO2 and breath sounds checked- equal and bilateral Secured at: 21 (teeth) cm Tube secured with: Tape Dental Injury: Teeth and Oropharynx as per pre-operative assessment

## 2016-01-06 NOTE — Transfer of Care (Signed)
Immediate Anesthesia Transfer of Care Note  Patient: Curtis Stewart  Procedure(s) Performed: Procedure(s): LAPAROSCOPIC RIGHT COLECTOMY (Right)  Patient Location: PACU  Anesthesia Type:General  Level of Consciousness: sedated  Airway & Oxygen Therapy: Patient Spontanous Breathing and Patient connected to face mask oxygen  Post-op Assessment: Report given to RN and Post -op Vital signs reviewed and stable  Post vital signs: Reviewed and stable  Last Vitals:  Vitals:   01/06/16 0607 01/06/16 0611  BP: 138/87   Pulse: 64   Resp: 18   Temp:  36.8 C    Last Pain:  Vitals:   01/06/16 0611  TempSrc: Oral         Complications: No apparent anesthesia complications

## 2016-01-06 NOTE — Op Note (Signed)
Preoperative diagnosis: Cecal polyp.  Postoperative diagnosis: Same.  Operative procedure: Laparoscopically assisted right hemicolectomy.  Operating surgeon: Audrea Muscat, M.D.  Asst. surgeon: Ellwood Sayers, M.D.  Anesthesia: Gen. endotracheal, Marcaine 0.5%, plain 30 mL local infiltration.  Estimate blood loss: Less than 50 mL.  Clinical note: This 59 year old male underwent a screening colonoscopy was found to have a large tubular adenoma extending behind the ileocecal valve. Its location precluded endoscopic resection. He was felt to be a candidate for right colectomy. The patient received a bowel prep prior to surgery as well as oral and breath on antibiotics. SCD stockings for DVT prevention.  Operative note: With the patient under adequate general endotracheal anesthesia a Foley catheter was placed by the nurse. The abdomen was cleansed with ChloraPrep and draped. Hared previously been removed with clippers. The abdomen was entered through a trans-umbilical incision with the patient in Trendelenburg position. Under anesthesia there was evidence of a small fascial defect at this level. After assuring intra-abdominal location with the hanging drop test the abdomen was insufflated with CO2 a 10 mmHg pressure. A 10 mm step port was expanded. Inspection showed no evidence of injury from initial port placement. There was noted to be scarring in the right lower quadrant from the patient's previous right inguinal hernia repair where the cecum and proximal 4 feet of terminal ileum had been found to be in the hernia sac. An 11 mm XL port was placed in left upper quadrant and a similar port placed in the hypogastrium under direct vision. A 5 mm XL port was placed in the epigastrium. The patient was rolled to the left and the adhesions to the inferior lateral peritoneal wall were taken down with Harmonic scalpel. After the cecum and right colon and terminal ileum had been freed attention was turned towards  the hepatic flexure. The omentum was suspended superiorly and the omental attachment taken down with the Harmonic scalpel. The hepatic flexure was rotated and fear medially with good exposure of the duodenum which was protected. Once the hepatic flexure could be brought to the left lower quadrant the patient was put in a supine position, the abdomen was desufflated and ports removed. An Alexis wound protector was placed after an 8 cm incision was made straddling the umbilicus and carried at the skin and subcutaneous anus tissue. A pad of pre-peritoneal fat was excised and discarded. Inspection showed the right colon easily mobilized into the wound. It was elected to create a side-to-side functional and end-to-end anastomosis making use of the GIA stapler. The mesentery was divided with Harmonic scalpel with the right colic artery divided with a 2-0 silk tie 2. The terminal ileum was transected approximate centimeter proximal to the ileocecal valve. The distal ileum was assessed sounds to the mid transverse colon with interrupted 3-0 silk sutures. Enterotomies were made in both loops of bowel and the GIA stapler inserted. Good hemostasis was noted. A second transverse application completed the anastomosis. This palpated to a proximally 4 cm in length. The mesenteric defect was closed with a running 3-0 Vicryl suture. The abdomen was irrigated and no bleeding was noted.  Surgeons gowns gloves and instruments were changed. The area was redraped. Fascial closure was completed with a single layer of 0 Prolene figure-of-eight sutures to the midline. The adipose tissue was approximated with interrupted 2-0 Vicryl figure-of-eight sutures. Skin incisions were closed with 4-0 Vicryl running subcuticular sutures for the main incision and individual subcuticular sutures for the port sites. Benzoin, Steri-Strips followed by Telfa  and Tegaderm dressings were placed at the port site. A honeycomb dressing was placed over the  midline wound.  The previously placed Foley catheter and NG tube were removed. The patient was then taken to recovery in stable condition.

## 2016-01-07 MED ORDER — BISACODYL 10 MG RE SUPP: 10.0000 mg | Freq: Once | RECTAL | Status: DC

## 2016-01-07 MED ORDER — ACETAMINOPHEN 325 MG PO TABS
650.0000 mg | ORAL_TABLET | ORAL | Status: DC | PRN
  Filled 2016-01-07 (×5): qty 2

## 2016-01-07 MED ORDER — ACETAMINOPHEN 325 MG PO TABS
650.0000 mg | ORAL_TABLET | ORAL | Status: DC
  Administered 2016-01-07 – 2016-01-08 (×6): 650 mg via ORAL
  Filled 2016-01-07: qty 2

## 2016-01-07 NOTE — Progress Notes (Addendum)
AVSS. Minimal pain. Ambulating well. Lungs: Clear. Inspirex at 1250. Cardio: RR. ABD: Modest distension, tympanitic, soft. Wounds: No further drainage. Calves: Soft. BP: Back to baseline. Doing well. Plan: Diet advance to full liquids.

## 2016-01-08 LAB — CBC
HCT: 32.9 % — ABNORMAL LOW (ref 40.0–52.0)
Hemoglobin: 11.6 g/dL — ABNORMAL LOW (ref 13.0–18.0)
MCH: 30.6 pg (ref 26.0–34.0)
MCHC: 35.2 g/dL (ref 32.0–36.0)
MCV: 87 fL (ref 80.0–100.0)
PLATELETS: 218 10*3/uL (ref 150–440)
RBC: 3.78 MIL/uL — ABNORMAL LOW (ref 4.40–5.90)
RDW: 14.6 % — AB (ref 11.5–14.5)
WBC: 7.5 10*3/uL (ref 3.8–10.6)

## 2016-01-08 LAB — CREATININE, SERUM
CREATININE: 1.02 mg/dL (ref 0.61–1.24)
GFR calc Af Amer: >60 mL/min (ref >60)

## 2016-01-08 MED ORDER — OXYCODONE HCL 5 MG PO TABS: 5.0000 mg | ORAL_TABLET | ORAL | 0 refills | Status: DC | PRN

## 2016-01-08 NOTE — Final Progress Note (Signed)
AVSS. Tolerated liquid diet well. + Flatus and BM. No nausea. Still with modest distension. No nausea. Lungs: Clear. Inspirex at 1750. Cardio: RR. ABD: Soft, mild distension, quiet. Wounds: Clean. If tolerates regular diet advance, will d/c home this afternoon.

## 2016-01-09 LAB — SURGICAL PATHOLOGY

## 2016-01-10 ENCOUNTER — Telehealth: Payer: Self-pay | Admitting: *Deleted

## 2016-01-10 NOTE — Telephone Encounter (Signed)
-----   Message from Robert Bellow, MD sent at 01/10/2016  8:54 AM EST ----- Please notify the patient that the pathology on the colon polyp was fine. He underwent a right hemicolectomy on Friday. He will be following up next week with you and then with me in 2 weeks. ----- Message ----- From: Interface, Lab In Three Zero One Sent: 01/09/2016   6:41 PM To: Robert Bellow, MD

## 2016-01-11 NOTE — Telephone Encounter (Signed)
Notified patient as instructed, patient pleased. Discussed follow-up appointments with Dr Bary Castilla in 2 weeks, patient agrees

## 2016-01-12 ENCOUNTER — Ambulatory Visit (INDEPENDENT_AMBULATORY_CARE_PROVIDER_SITE_OTHER): Payer: BLUE CROSS/BLUE SHIELD | Admitting: *Deleted

## 2016-01-12 DIAGNOSIS — K635 Polyp of colon: Secondary | ICD-10-CM

## 2016-01-12 NOTE — Progress Notes (Signed)
Patient ID: Curtis Stewart, male   DOB: 04/23/1955, 60 y.o.   MRN: [id number]  Patient came in today for a wound check.  The wound is clean, with no signs of infection noted. 3 lap sites and midline with steri strips clean. Follow up as scheduled. He is passing flatus and bowels are moving. Denies nausea or vomiting.

## 2016-01-12 NOTE — Patient Instructions (Signed)
The patient is aware to call back for any questions or concerns.  

## 2016-01-26 ENCOUNTER — Ambulatory Visit (INDEPENDENT_AMBULATORY_CARE_PROVIDER_SITE_OTHER): Payer: BLUE CROSS/BLUE SHIELD | Admitting: General Surgery

## 2016-01-26 ENCOUNTER — Encounter: Payer: Self-pay | Admitting: General Surgery

## 2016-01-26 ENCOUNTER — Encounter: Payer: Self-pay | Admitting: *Deleted

## 2016-01-26 VITALS — BP 122/74 | HR 74 | Resp 12 | Ht 70.0 in | Wt 222.0 lb

## 2016-01-26 DIAGNOSIS — K635 Polyp of colon: Secondary | ICD-10-CM

## 2016-01-26 NOTE — Progress Notes (Signed)
Dictation #1 [id number]  [id number] Patient ID: Curtis Stewart, male   DOB: May 25, 1955, 61 y.o.   MRN: [id number]  Chief Complaint  Patient presents with  . Routine Post Op    HPI Curtis Stewart is a 61 y.o. male here today for  his post op Right colectomy done on 01/06/2016. Patient states he is doing well. Bowels move daily, no bleeding. He does feel like he has a hemorrhoid. 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  HPI  Past Medical History:  Diagnosis Date  . History of chicken pox   . Hypertension   . Inguinal hernia     Past Surgical History:  Procedure Laterality Date  . COLONOSCOPY  2011   [place]   . COLONOSCOPY WITH PROPOFOL N/A 09/20/2015   Procedure: COLONOSCOPY WITH PROPOFOL;  Surgeon: Robert Bellow, MD;  Location: [facility] ENDOSCOPY;  Service: Endoscopy;  Laterality: N/A;  . INGUINAL HERNIA REPAIR Right 10/27/2015   Procedure: HERNIA REPAIR INGUINAL ADULT;  Surgeon: Robert Bellow, MD;  Location: [facility] ORS;  Service: General;  Laterality: Right;  . LAPAROSCOPIC RIGHT COLECTOMY Right 01/06/2016   Procedure: LAPAROSCOPIC RIGHT COLECTOMY;  Surgeon: Robert Bellow, MD;  Location: [facility] ORS;  Service: General;  Laterality: Right;  . LIPOMA EXCISION Right 10/27/2015   Procedure: EXCISION LIPOMA;  Surgeon: Robert Bellow, MD;  Location: [facility] ORS;  Service: General;  Laterality: Right;  Marland Kitchen VIDEO BRONCHOSCOPY Bilateral 03/18/2012   Procedure: VIDEO BRONCHOSCOPY WITH FLUORO;  Surgeon: Tanda Rockers, MD;  Location: [facility] ENDOSCOPY;  Service: Cardiopulmonary;  Laterality: Bilateral;    Family History  Problem Relation Age of Onset  . Diabetes Mother   . Hypertension Mother   . Diabetes Father   . Emphysema Father   . Cancer Father     bladder  . Hypertension Father     Social History Social History  Substance Use Topics  . Smoking status: Former Smoker    Packs/day: 1.00    Years: 20.00    Types: Cigarettes    Quit  date: 11/23/2010  . Smokeless tobacco: Never Used  . Alcohol use 0.0 oz/week     Comment: rarely    No Known Allergies  Current Outpatient Prescriptions  Medication Sig Dispense Refill  . lisinopril-hydrochlorothiazide (PRINZIDE,ZESTORETIC) 20-25 MG tablet TAKE 1 TABLET BY MOUTH DAILY. 30 tablet 2   No current facility-administered medications for this visit.     Review of Systems Review of Systems  Constitutional: Negative.   Respiratory: Negative.   Cardiovascular: Negative.   Gastrointestinal: Negative for abdominal pain, nausea and vomiting.    Blood pressure 122/74, pulse 74, resp. rate 12, height 5\' 10"  (1.778 m), weight 222 lb (100.7 kg).  Physical Exam Physical Exam  Abdominal:      Data Reviewed DIAGNOSIS:  A. RIGHT COLON; HEMICOLECTOMY:  - TUBULOVILLOUS ADENOMA OF CECUM, 2.8 CM.  - INCIDENTAL TINY TUBULAR ADENOMA AT DISTAL MARGIN.  - BOTH ADENOMAS NEGATIVE FOR HIGH-GRADE DYSPLASIA AND MALIGNANCY.  - DIVERTICULOSIS OF COLON.  - SMALL DIVERTICULUM OF APPENDIX.  - 12 REGIONAL LYMPH NODES WITHOUT PATHOLOGIC CHANGES.  Assessment    Doing well status post laparoscopically assisted right hemicolectomy.    Plan    Proper lifting technique reviewed.  We'll plan for follow-up examination in one month.       This information has been scribed by Karie Fetch RN, BSN,BC.    Robert Bellow 01/31/2016, 10:29 AM

## 2016-02-23 ENCOUNTER — Ambulatory Visit (INDEPENDENT_AMBULATORY_CARE_PROVIDER_SITE_OTHER): Payer: BLUE CROSS/BLUE SHIELD | Admitting: General Surgery

## 2016-02-23 ENCOUNTER — Encounter: Payer: Self-pay | Admitting: General Surgery

## 2016-02-23 VITALS — BP 140/80 | HR 66 | Resp 14 | Ht 70.0 in | Wt 225.0 lb

## 2016-02-23 DIAGNOSIS — K635 Polyp of colon: Secondary | ICD-10-CM

## 2016-02-23 NOTE — Progress Notes (Deleted)
Patient ID: Curtis Stewart, male   DOB: 1955-08-14, 61 y.o.   MRN: [id number]  Chief Complaint  Patient presents with  . Routine Post Op    HPI Curtis Stewart is a 61 y.o. male here today for  his post op Right colectomy done on 01/06/2016. Patient states he is doing well. Bowels move daily, no bleeding HPI  Past Medical History:  Diagnosis Date  . History of chicken pox   . Hypertension   . Inguinal hernia     Past Surgical History:  Procedure Laterality Date  . COLONOSCOPY  2011   [place]   . COLONOSCOPY WITH PROPOFOL N/A 09/20/2015   Procedure: COLONOSCOPY WITH PROPOFOL;  Surgeon: Robert Bellow, MD;  Location: [facility] ENDOSCOPY;  Service: Endoscopy;  Laterality: N/A;  . INGUINAL HERNIA REPAIR Right 10/27/2015   Procedure: HERNIA REPAIR INGUINAL ADULT;  Surgeon: Robert Bellow, MD;  Location: [facility] ORS;  Service: General;  Laterality: Right;  . LAPAROSCOPIC RIGHT COLECTOMY Right 01/06/2016   Procedure: LAPAROSCOPIC RIGHT COLECTOMY;  Surgeon: Robert Bellow, MD;  Location: [facility] ORS;  Service: General;  Laterality: Right;  . LIPOMA EXCISION Right 10/27/2015   Procedure: EXCISION LIPOMA;  Surgeon: Robert Bellow, MD;  Location: [facility] ORS;  Service: General;  Laterality: Right;  Marland Kitchen VIDEO BRONCHOSCOPY Bilateral 03/18/2012   Procedure: VIDEO BRONCHOSCOPY WITH FLUORO;  Surgeon: Tanda Rockers, MD;  Location: [facility] ENDOSCOPY;  Service: Cardiopulmonary;  Laterality: Bilateral;    Family History  Problem Relation Age of Onset  . Diabetes Mother   . Hypertension Mother   . Diabetes Father   . Emphysema Father   . Cancer Father     bladder  . Hypertension Father     Social History Social History  Substance Use Topics  . Smoking status: Former Smoker    Packs/day: 1.00    Years: 20.00    Types: Cigarettes    Quit date: 11/23/2010  . Smokeless tobacco: Never Used  . Alcohol use 0.0 oz/week     Comment: rarely    No Known Allergies  Current Outpatient Prescriptions   Medication Sig Dispense Refill  . lisinopril-hydrochlorothiazide (PRINZIDE,ZESTORETIC) 20-25 MG tablet TAKE 1 TABLET BY MOUTH DAILY. 30 tablet 2   No current facility-administered medications for this visit.     Review of Systems Review of Systems  Constitutional: Negative.   Respiratory: Negative.   Cardiovascular: Negative.     Blood pressure 140/80, pulse 66, resp. rate 14, height 5\' 10"  (1.778 m), weight 225 lb (102.1 kg).  Physical Exam Physical Exam  Constitutional: He is oriented to person, place, and time. He appears well-developed and well-nourished.  Neurological: He is alert and oriented to person, place, and time.  Skin: Skin is warm and dry.    Data Reviewed ***  Assessment    ***    Plan    ***     This information has been scribed by Gaspar Cola CMA.   Gaspar Cola 02/23/2016, 10:53 AM

## 2016-02-23 NOTE — Progress Notes (Signed)
Patient ID: Curtis Curtis, male   DOB: 1955/05/10, 61 y.o.   MRN: [id number]  Chief Complaint  Patient presents with  . Routine Post Op    HPI Curtis Curtis is a 61 y.o. male.  here today for  his post op Right colectomy done on 01/06/2016. Patient states he is doing well. Bowels move daily, no bleeding.   HPI  Past Medical History:  Diagnosis Date  . History of chicken pox   . Hypertension   . Inguinal hernia     Past Surgical History:  Procedure Laterality Date  . COLONOSCOPY  2011   [place]   . COLONOSCOPY WITH PROPOFOL N/A 09/20/2015   Procedure: COLONOSCOPY WITH PROPOFOL;  Surgeon: Robert Bellow, MD;  Location: [facility] ENDOSCOPY;  Service: Endoscopy;  Laterality: N/A;  . INGUINAL HERNIA REPAIR Right 10/27/2015   Procedure: HERNIA REPAIR INGUINAL ADULT;  Surgeon: Robert Bellow, MD;  Location: [facility] ORS;  Service: General;  Laterality: Right;  . LAPAROSCOPIC RIGHT COLECTOMY Right 01/06/2016   Procedure: LAPAROSCOPIC RIGHT COLECTOMY;  Surgeon: Robert Bellow, MD;  Location: [facility] ORS;  Service: General;  Laterality: Right;  . LIPOMA EXCISION Right 10/27/2015   Procedure: EXCISION LIPOMA;  Surgeon: Robert Bellow, MD;  Location: [facility] ORS;  Service: General;  Laterality: Right;  Marland Kitchen VIDEO BRONCHOSCOPY Bilateral 03/18/2012   Procedure: VIDEO BRONCHOSCOPY WITH FLUORO;  Surgeon: Tanda Rockers, MD;  Location: [facility] ENDOSCOPY;  Service: Cardiopulmonary;  Laterality: Bilateral;    Family History  Problem Relation Age of Onset  . Diabetes Mother   . Hypertension Mother   . Diabetes Father   . Emphysema Father   . Cancer Father     bladder  . Hypertension Father     Social History Social History  Substance Use Topics  . Smoking status: Former Smoker    Packs/day: 1.00    Years: 20.00    Types: Cigarettes    Quit date: 11/23/2010  . Smokeless tobacco: Never Used  . Alcohol use 0.0 oz/week     Comment: rarely    No Known Allergies  Current Outpatient  Prescriptions  Medication Sig Dispense Refill  . lisinopril-hydrochlorothiazide (PRINZIDE,ZESTORETIC) 20-25 MG tablet TAKE 1 TABLET BY MOUTH DAILY. 30 tablet 2   No current facility-administered medications for this visit.     Review of Systems Review of Systems  Blood pressure 140/80, pulse 66, resp. rate 14, height 5\' 10"  (1.778 m), weight 225 lb (102.1 kg).  Physical Exam Physical Exam  Constitutional: He is oriented to person, place, and time. He appears well-developed and well-nourished.  HENT:  Mouth/Throat: Oropharynx is clear and moist.  Abdominal: Soft. Bowel sounds are normal. There is no tenderness.    Musculoskeletal:       Legs: Neurological: He is alert and oriented to person, place, and time.  Skin: Skin is warm and dry.  Psychiatric: His behavior is normal.    Data Reviewed RIGHT COLON; HEMICOLECTOMY:  - TUBULOVILLOUS ADENOMA OF CECUM, 2.8 CM.  - INCIDENTAL TINY TUBULAR ADENOMA AT DISTAL MARGIN.  - BOTH ADENOMAS NEGATIVE FOR HIGH-GRADE DYSPLASIA AND MALIGNANCY.  - DIVERTICULOSIS OF COLON.  - SMALL DIVERTICULUM OF APPENDIX.  - 12 REGIONAL LYMPH NODES WITHOUT PATHOLOGIC CHANGES.RIGHT COLON; HEMICOLECTOMY:  - TUBULOVILLOUS ADENOMA OF CECUM, 2.8 CM.  - INCIDENTAL TINY TUBULAR ADENOMA AT DISTAL MARGIN.  - BOTH ADENOMAS NEGATIVE FOR HIGH-GRADE DYSPLASIA AND MALIGNANCY.  - DIVERTICULOSIS OF COLON.  - SMALL DIVERTICULUM OF APPENDIX.  - 12 REGIONAL LYMPH NODES  WITHOUT PATHOLOGIC CHANGES.  Assessment    Doing well status post right hemicolectomy.    Plan    In light of the findings of a tubulovillous adenoma, the patient will be encouraged to have a repeat colonoscopy in 3 years.  He'll report if the lower extremity swelling at the site of the previous lipoma excision enlarges or becomes symptomatic.    . This information has been scribed by Karie Fetch RN, BSN,BC.   Robert Bellow 02/23/2016, 9:13 PM

## 2016-02-23 NOTE — Patient Instructions (Addendum)
The patient is aware to call back for any questions or concerns.   Return in five years colonoscopy.

## 2016-02-24 ENCOUNTER — Telehealth: Payer: Self-pay

## 2016-02-24 NOTE — Telephone Encounter (Signed)
-----   Message from Robert Bellow, MD sent at 02/23/2016  9:16 PM EST ----- When the patient was in the office I told him we do a repeat colonoscopy in 5 years. After review the pathology I would encourage him to have this exam completed in 3 years.Please let them know. Thank you

## 2016-02-28 NOTE — Telephone Encounter (Signed)
Notified patient as instructed, patient pleased. Discussed follow-up appointments, patient agrees. Patient placed in recalls for 3 years.   

## 2016-04-03 ENCOUNTER — Telehealth: Payer: Self-pay | Admitting: *Deleted

## 2016-04-03 DIAGNOSIS — I10 Essential (primary) hypertension: Secondary | ICD-10-CM

## 2016-04-03 MED ORDER — LISINOPRIL-HYDROCHLOROTHIAZIDE 20-25 MG PO TABS: 1.0000 | ORAL_TABLET | Freq: Every day | ORAL | 1 refills | Status: AC

## 2016-04-03 NOTE — Telephone Encounter (Addendum)
Received faxed refill request from pharmacy for [place] See office notes from 10/13/15  Pharmacy is requesting a 90 day supply

## 2016-04-03 NOTE — Telephone Encounter (Signed)
Please check on patient's home BP readings. Refill sent to pharmacy.

## 2016-04-04 NOTE — Telephone Encounter (Signed)
Per DPR, left detail message for patient to call back.  

## 2016-04-10 NOTE — Telephone Encounter (Signed)
Please close encounter if completed

## 2018-08-19 ENCOUNTER — Encounter: Payer: Self-pay | Admitting: General Surgery

## 2018-12-24 ENCOUNTER — Telehealth: Payer: Self-pay

## 2018-12-24 NOTE — Telephone Encounter (Signed)
Left detailed message for patient to give our office a return call.  Patient returned call to office stating that he was currently on a work site and wasn't able to discuss where he like to be referred for new GI location. Patient states he will give our office a call back at his earliest convenience.

## 2022-10-11 DIAGNOSIS — Z87891 Personal history of nicotine dependence: Secondary | ICD-10-CM | POA: Diagnosis not present

## 2022-10-11 DIAGNOSIS — M7021 Olecranon bursitis, right elbow: Secondary | ICD-10-CM | POA: Diagnosis not present

## 2022-10-11 DIAGNOSIS — I10 Essential (primary) hypertension: Secondary | ICD-10-CM | POA: Diagnosis not present

## 2022-10-11 DIAGNOSIS — Z79899 Other long term (current) drug therapy: Secondary | ICD-10-CM | POA: Diagnosis not present

## 2022-10-20 DIAGNOSIS — I10 Essential (primary) hypertension: Secondary | ICD-10-CM | POA: Diagnosis not present
